# Patient Record
Sex: Female | Born: 1985 | Race: White | Hispanic: No
Health system: Southern US, Academic
[De-identification: ages and names within clinical notes are randomized; demographics above are authoritative.]

## PROBLEM LIST (undated history)

## (undated) ENCOUNTER — Encounter
Attending: Student in an Organized Health Care Education/Training Program | Primary: Student in an Organized Health Care Education/Training Program

## (undated) ENCOUNTER — Encounter: Attending: Psychiatric/Mental Health | Primary: Psychiatric/Mental Health

## (undated) ENCOUNTER — Telehealth: Attending: Psychiatric/Mental Health | Primary: Psychiatric/Mental Health

## (undated) ENCOUNTER — Encounter

## (undated) ENCOUNTER — Encounter: Attending: Family | Primary: Family

## (undated) ENCOUNTER — Telehealth
Attending: Student in an Organized Health Care Education/Training Program | Primary: Student in an Organized Health Care Education/Training Program

## (undated) ENCOUNTER — Encounter: Attending: Psychosomatic Medicine | Primary: Psychosomatic Medicine

## (undated) ENCOUNTER — Encounter: Attending: Gastroenterology | Primary: Gastroenterology

## (undated) ENCOUNTER — Ambulatory Visit: Attending: Clinical | Primary: Clinical

## (undated) ENCOUNTER — Encounter: Attending: Family Medicine | Primary: Family Medicine

## (undated) ENCOUNTER — Ambulatory Visit

## (undated) ENCOUNTER — Telehealth

## (undated) ENCOUNTER — Telehealth: Attending: Marriage & Family Therapist | Primary: Marriage & Family Therapist

## (undated) ENCOUNTER — Telehealth: Attending: Ambulatory Care | Primary: Ambulatory Care

## (undated) ENCOUNTER — Ambulatory Visit: Attending: Psychosomatic Medicine | Primary: Psychosomatic Medicine

## (undated) ENCOUNTER — Ambulatory Visit: Attending: Internal Medicine | Primary: Internal Medicine

## (undated) ENCOUNTER — Ambulatory Visit: Attending: Pharmacist | Primary: Pharmacist

## (undated) ENCOUNTER — Ambulatory Visit: Attending: Psychiatric/Mental Health | Primary: Psychiatric/Mental Health

## (undated) ENCOUNTER — Telehealth: Attending: Family | Primary: Family

## (undated) ENCOUNTER — Ambulatory Visit: Payer: MEDICAID | Attending: Addiction (Substance Use Disorder) | Primary: Addiction (Substance Use Disorder)

## (undated) ENCOUNTER — Telehealth: Attending: Clinical | Primary: Clinical

## (undated) ENCOUNTER — Telehealth: Attending: Addiction (Substance Use Disorder) | Primary: Addiction (Substance Use Disorder)

## (undated) ENCOUNTER — Ambulatory Visit
Attending: Student in an Organized Health Care Education/Training Program | Primary: Student in an Organized Health Care Education/Training Program

## (undated) ENCOUNTER — Ambulatory Visit: Attending: Women's Health | Primary: Women's Health

## (undated) ENCOUNTER — Ambulatory Visit: Payer: MEDICAID

## (undated) ENCOUNTER — Ambulatory Visit
Attending: Pharmacist Clinician (PhC)/ Clinical Pharmacy Specialist | Primary: Pharmacist Clinician (PhC)/ Clinical Pharmacy Specialist

## (undated) ENCOUNTER — Encounter: Attending: Ambulatory Care | Primary: Ambulatory Care

## (undated) ENCOUNTER — Ambulatory Visit: Attending: Nurse Practitioner | Primary: Nurse Practitioner

## (undated) ENCOUNTER — Encounter
Attending: Pharmacist Clinician (PhC)/ Clinical Pharmacy Specialist | Primary: Pharmacist Clinician (PhC)/ Clinical Pharmacy Specialist

## (undated) ENCOUNTER — Ambulatory Visit: Attending: Family | Primary: Family

## (undated) ENCOUNTER — Ambulatory Visit: Attending: Dermatology | Primary: Dermatology

## (undated) HISTORY — PX: WISDOM TOOTH EXTRACTION: SHX21

## (undated) HISTORY — PX: TONSILLECTOMY: SUR1361

## (undated) HISTORY — PX: OTHER SURGICAL HISTORY: SHX169

## (undated) MED ORDER — CLONIDINE HCL 0.1 MG TABLET: 0 mg | tablet | Freq: Four times a day (QID) | 2 refills | 30 days

---
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
## 2005-04-19 ENCOUNTER — Ambulatory Visit (HOSPITAL_COMMUNITY): Admission: RE | Admit: 2005-04-19 | Discharge: 2005-04-19 | Payer: Self-pay | Admitting: Obstetrics & Gynecology

## 2005-07-10 ENCOUNTER — Ambulatory Visit (HOSPITAL_COMMUNITY): Admission: RE | Admit: 2005-07-10 | Discharge: 2005-07-10 | Payer: Self-pay | Admitting: Obstetrics & Gynecology

## 2005-07-25 ENCOUNTER — Inpatient Hospital Stay (HOSPITAL_COMMUNITY): Admission: AD | Admit: 2005-07-25 | Discharge: 2005-07-25 | Payer: Self-pay | Admitting: Family Medicine

## 2005-07-26 ENCOUNTER — Inpatient Hospital Stay (HOSPITAL_COMMUNITY): Admission: AD | Admit: 2005-07-26 | Discharge: 2005-07-30 | Payer: Self-pay | Admitting: Obstetrics

## 2005-10-03 ENCOUNTER — Ambulatory Visit: Payer: Self-pay | Admitting: Family Medicine

## 2006-04-26 DIAGNOSIS — L900 Lichen sclerosus et atrophicus: Secondary | ICD-10-CM

## 2006-04-26 HISTORY — DX: Lichen sclerosus et atrophicus: L90.0

## 2007-01-04 IMAGING — US US ABDOMEN COMPLETE
1 series · 14 of 25 positions shown · non-contrast
Comparison: none

CLINICAL DATA: Abdominal pain.  The patient is currently 22 weeks pregnancy.
ABDOMINAL ULTRASOUND:
Multiple images of the abdomen were obtained.
The liver parenchyma is homogeneous in echotexture with no evidence for focal parenchymal abnormality or intrahepatic ductal dilatation.  The gallbladder is well distended and shows no evidence for intraluminal stones or sludge.  No pericholecystic fluid or gallbladder wall thickening is seen.  The common bile duct measures 1.6 mm in AP width and this is within normal limits for size.  
Both kidneys were well-seen with the right kidney having a sagittal length of 9.9 cm and the left kidney having a sagittal length of 10.2 cm.  No signs of hydronephrosis or focal parenchymal abnormalities are seen.  
The pancreas is seen in its entirety and is normal in size and echotexture, as is the spleen.  The proximal portions of the inferior vena cava and abdominal aorta are unremarkable.  The distal aspect of these two vessels were obscured by the gravid uterus.

[Series 1: us abdomen complete · 0.31mm/px · 14 of 73 slices shown]
[im 1/73]
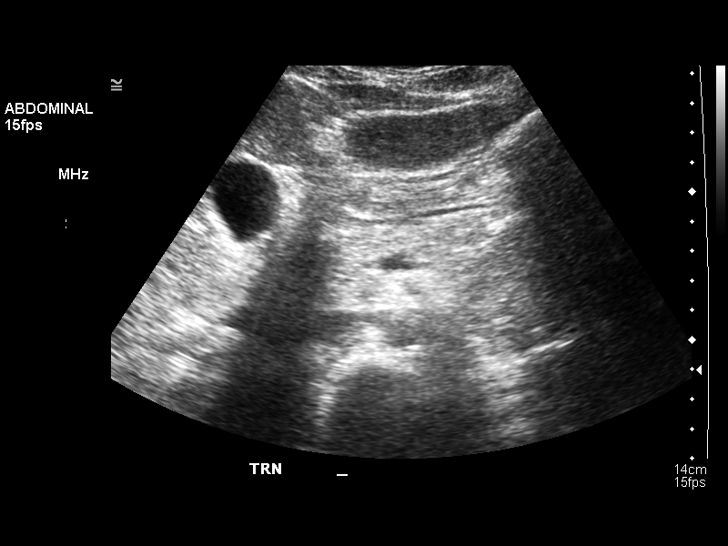
[im 7/73]
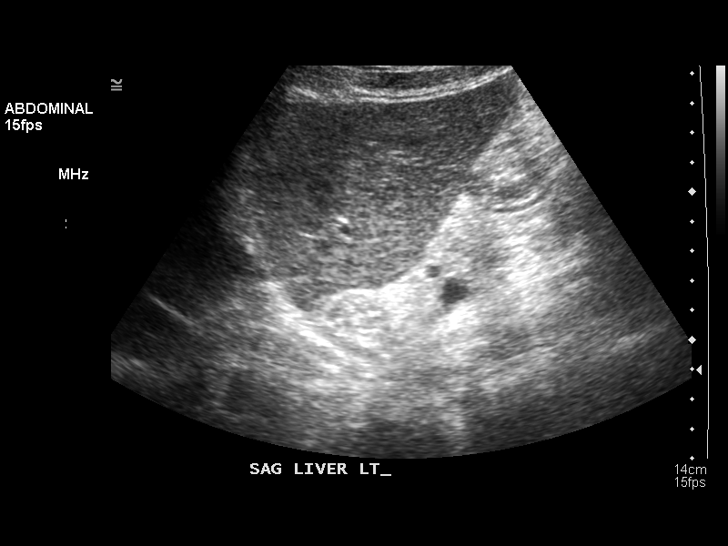
[im 13/73]
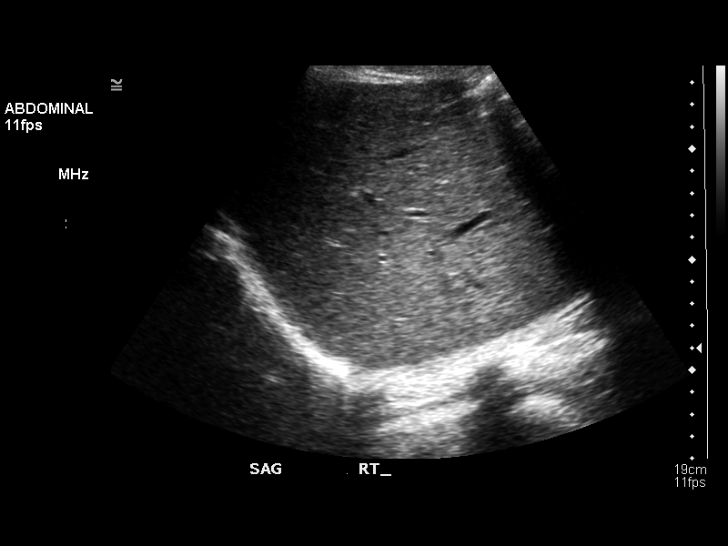
[im 19/73]
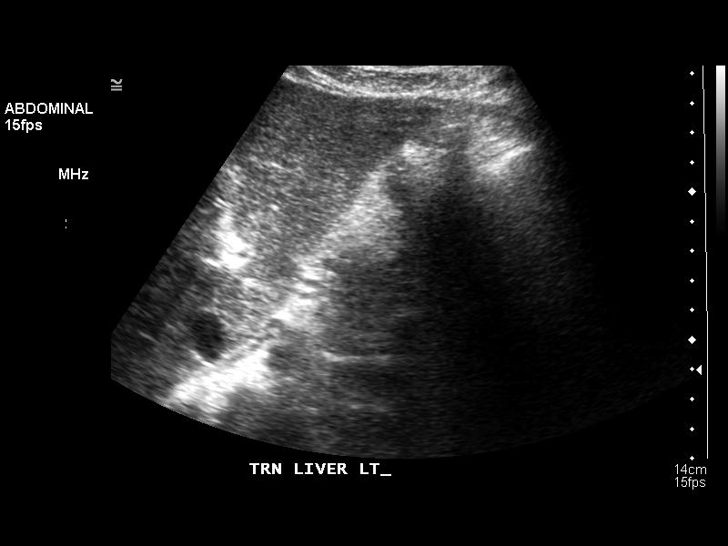
[im 25/73]
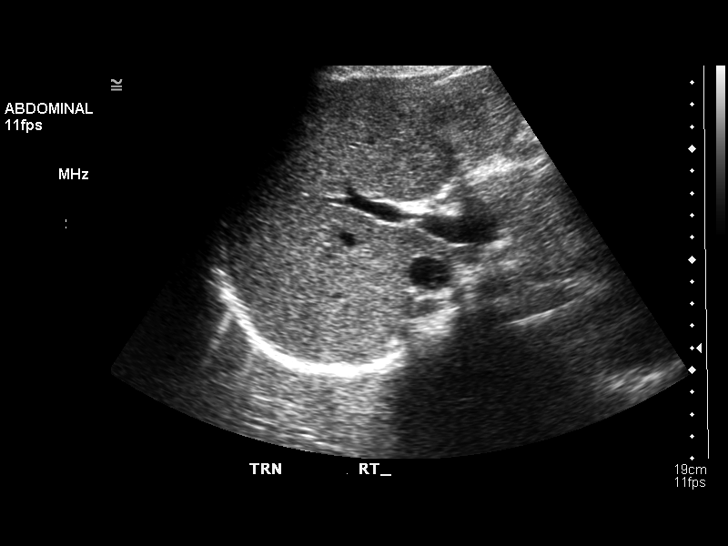
[im 28/73]
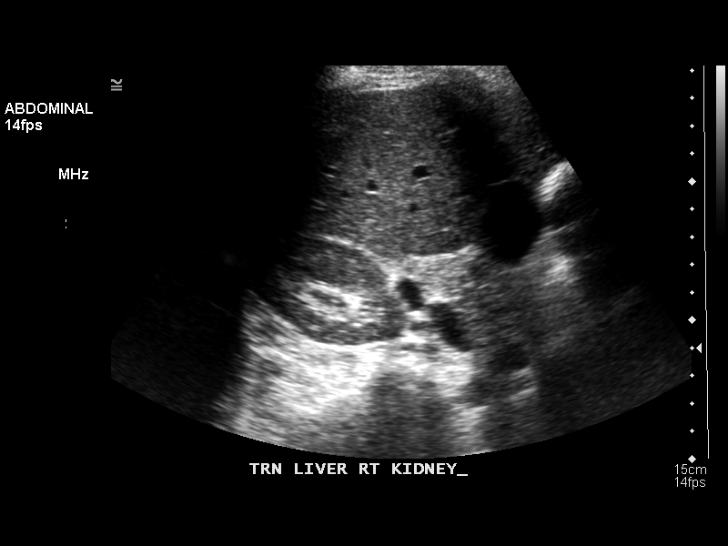
[im 34/73]
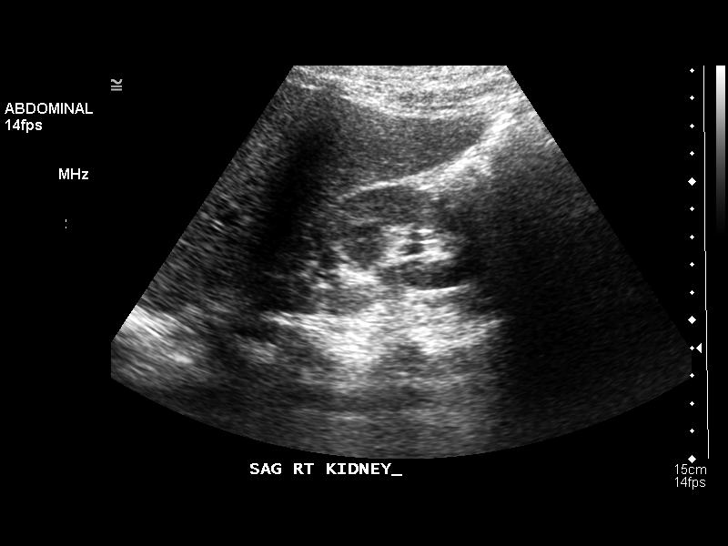
[im 40/73]
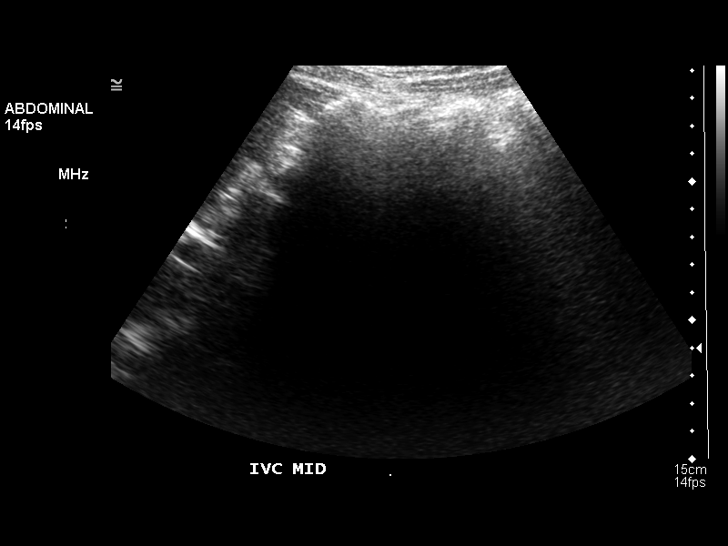
[im 46/73]
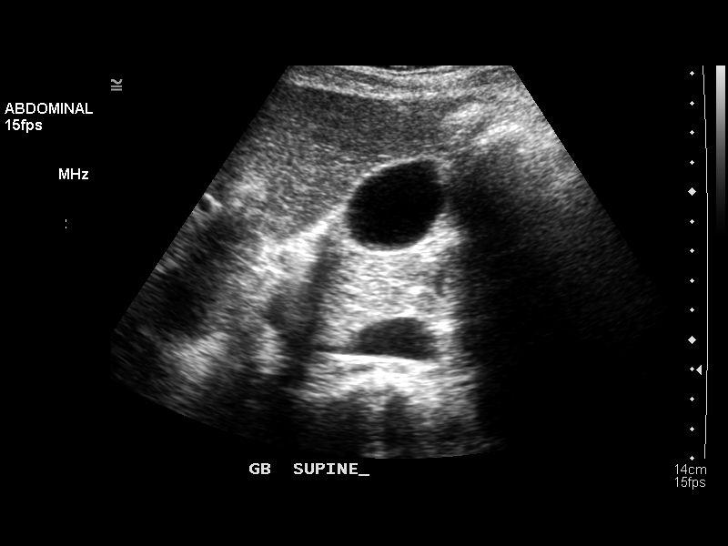
[im 49/73]
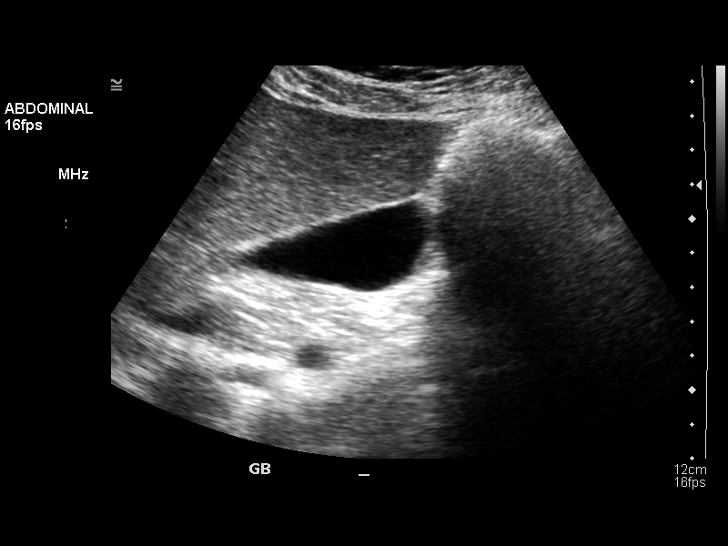
[im 55/73]
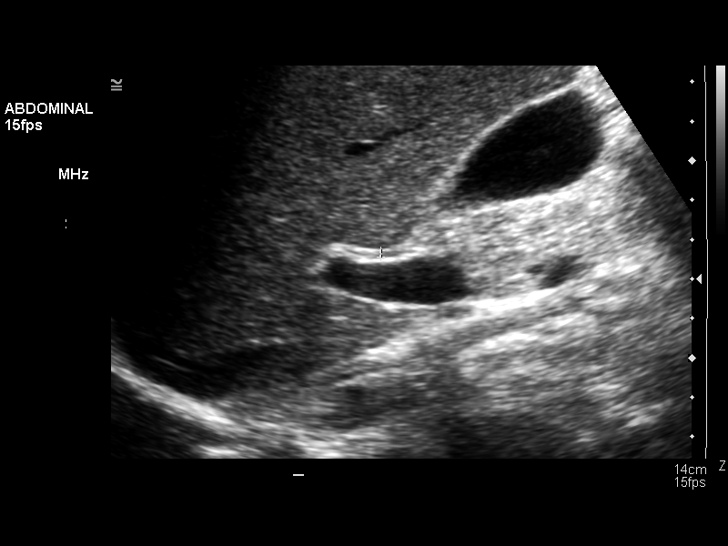
[im 61/73]
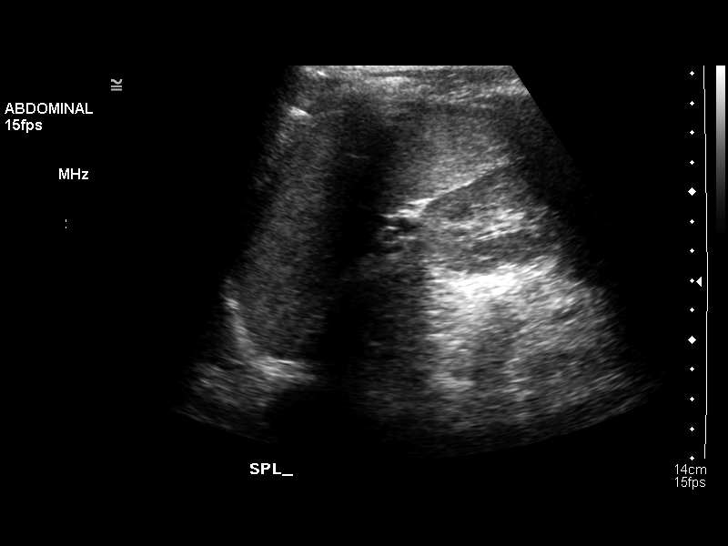
[im 67/73]
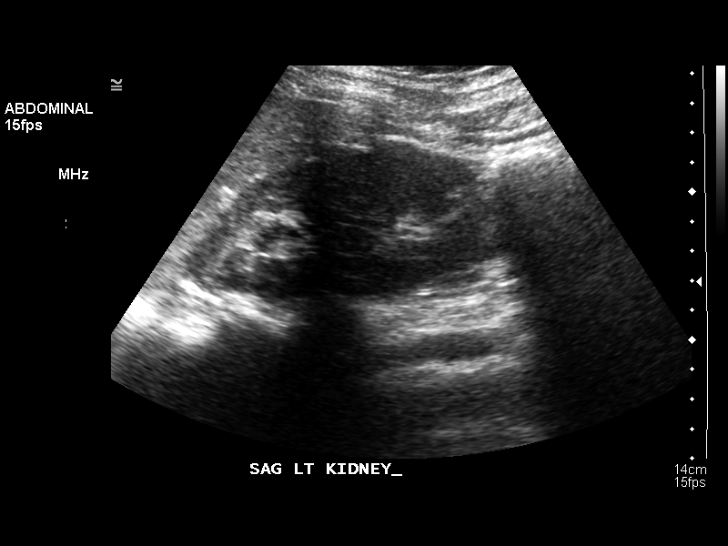
[im 73/73]
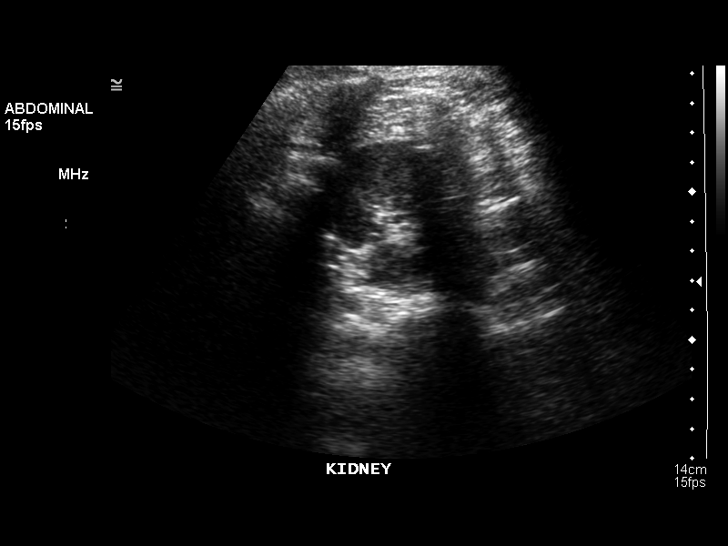

[14 of 25 positions shown; findings below may reference images not displayed]

IMPRESSION: Normal abdominal ultrasound.

## 2007-03-27 IMAGING — US US FETAL BPP W/O NONSTRESS
1 series · 14 of 27 positions shown · non-contrast
Comparison: none

CLINICAL DATA: Elevated blood pressure and nonreactive NST.  Assigned gestational age is 34 weeks 0 days.

[Series 1: us fetal bpp w/o nonstress · 0.41mm/px · 14 of 27 slices shown]
[im 1/27]
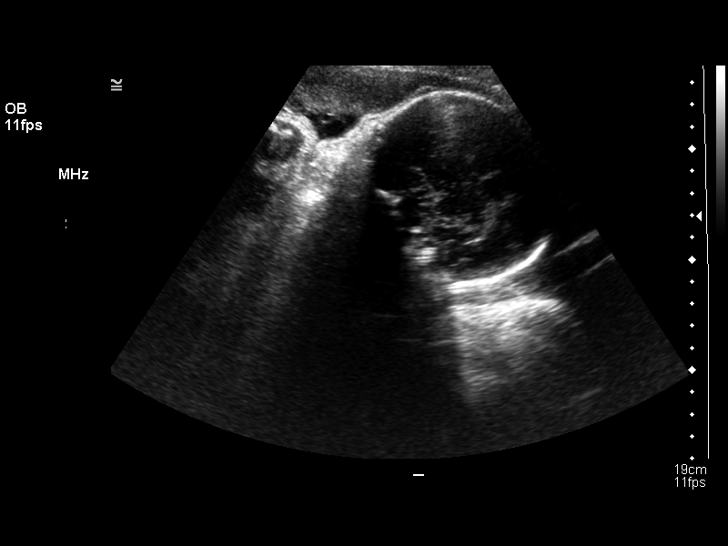
[im 3/27]
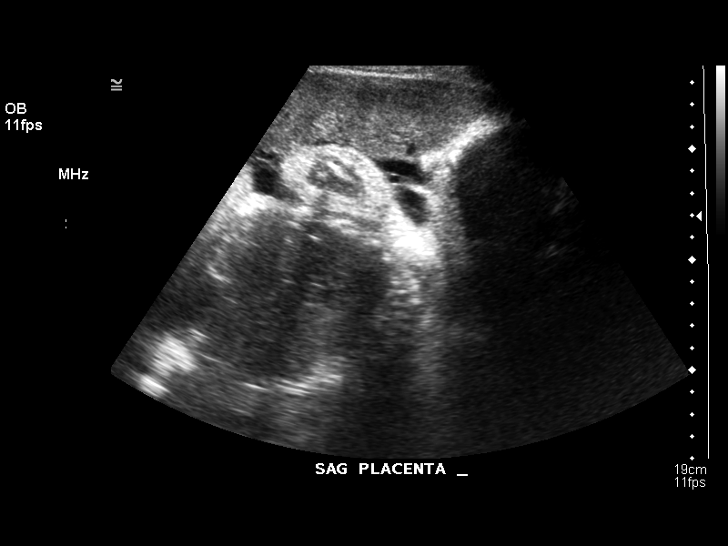
[im 5/27]
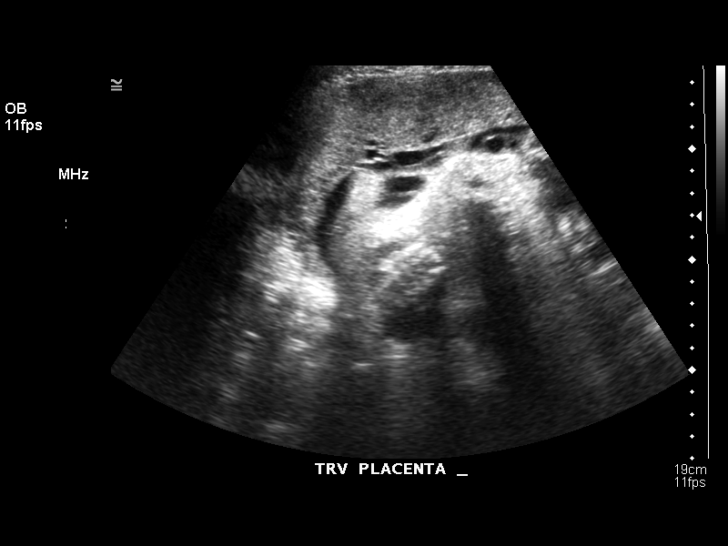
[im 7/27]
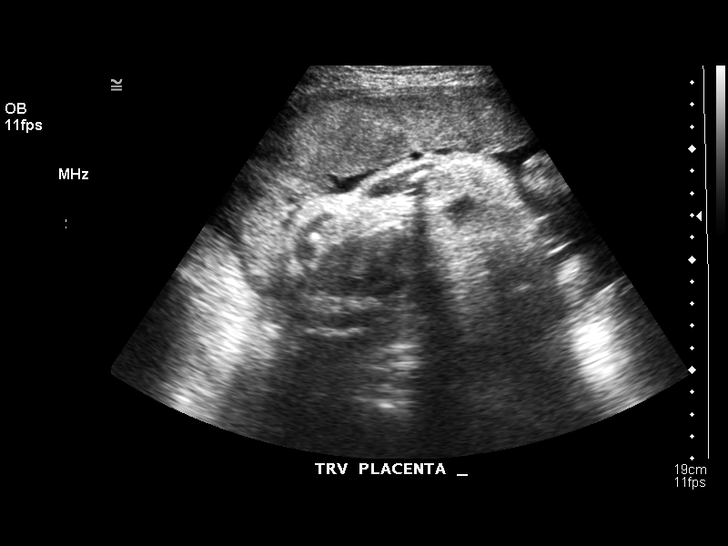
[im 9/27]
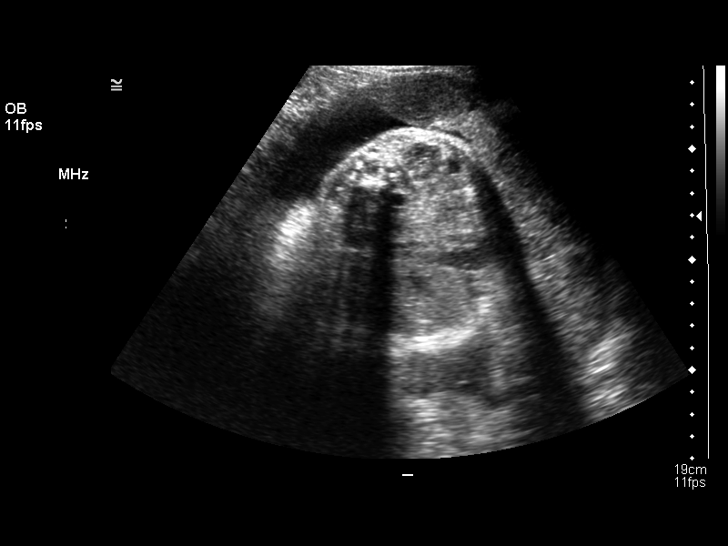
[im 11/27]
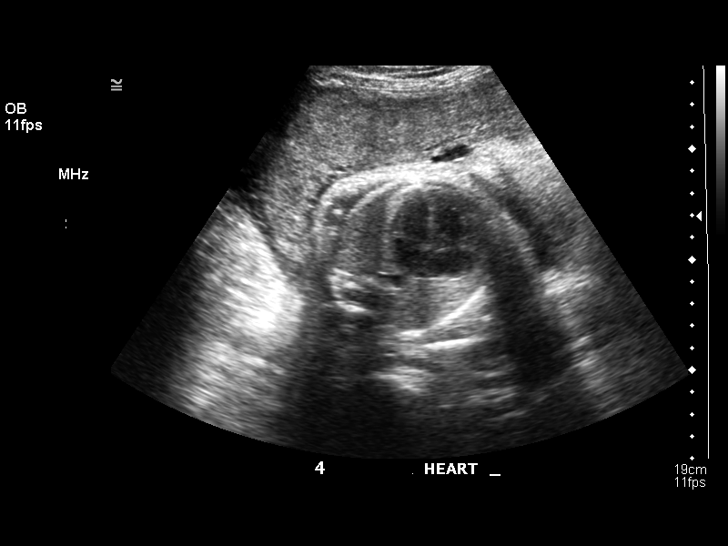
[im 13/27]
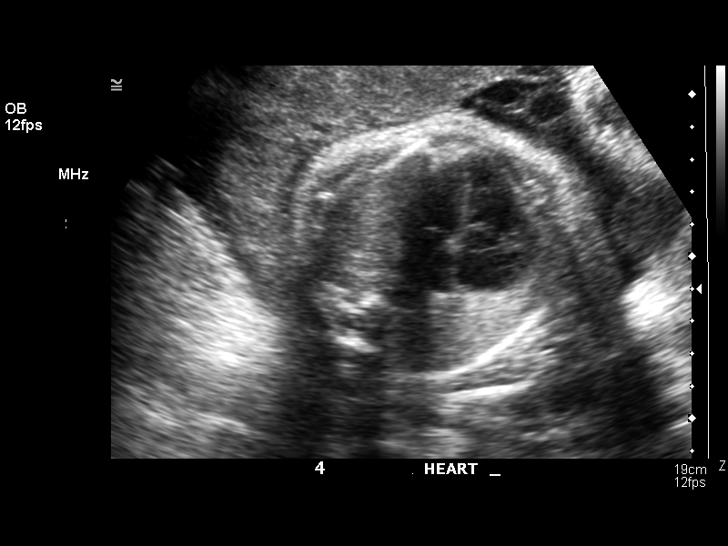
[im 15/27]
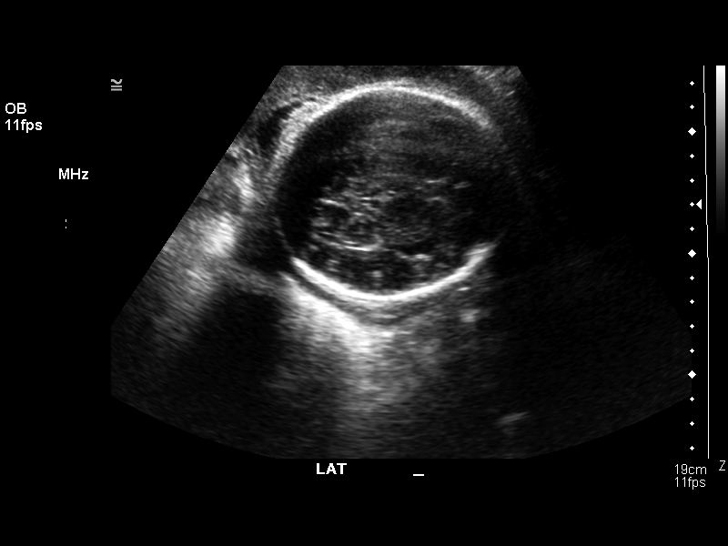
[im 17/27]
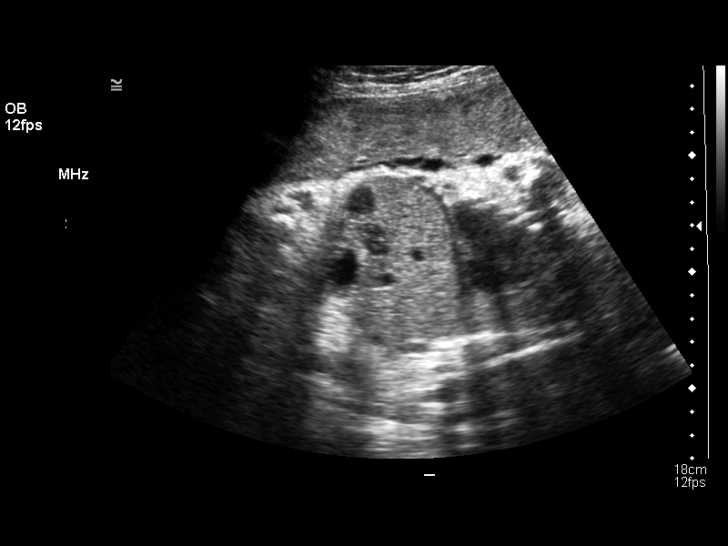
[im 19/27]
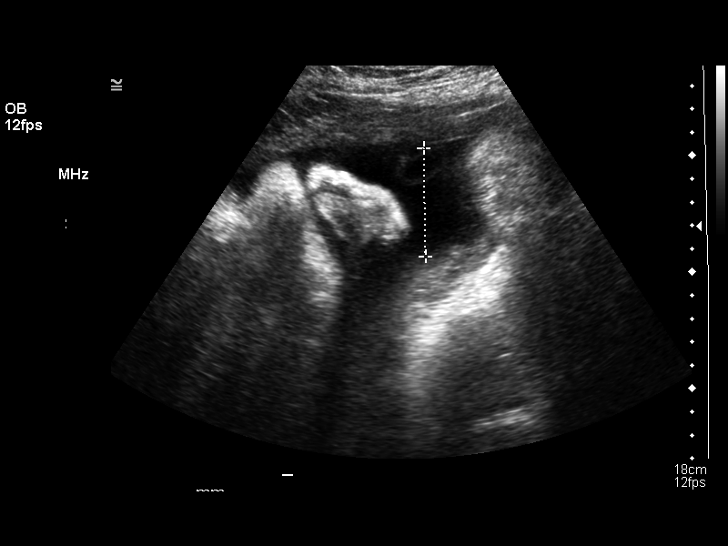
[im 21/27]
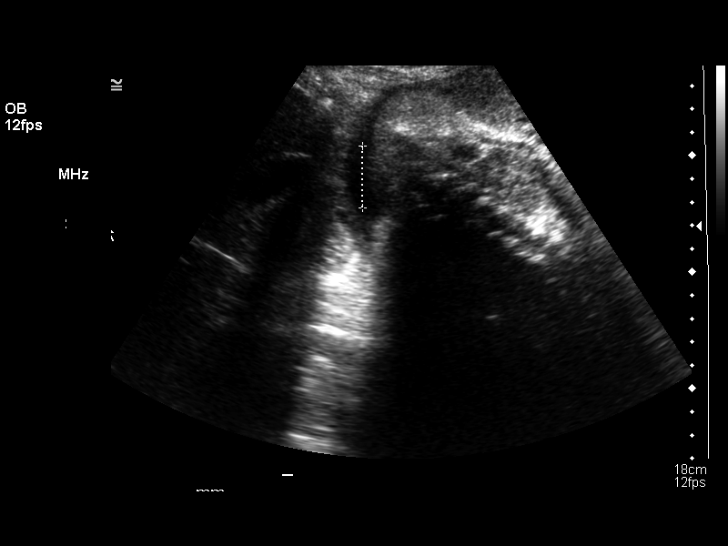
[im 23/27]
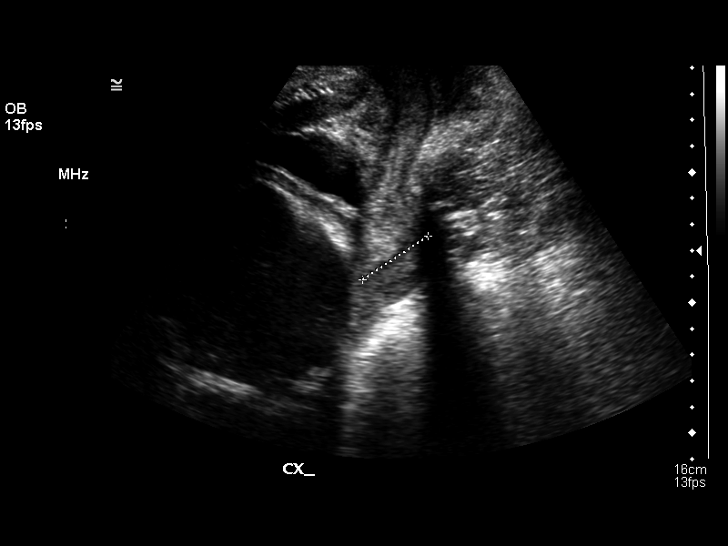
[im 25/27]
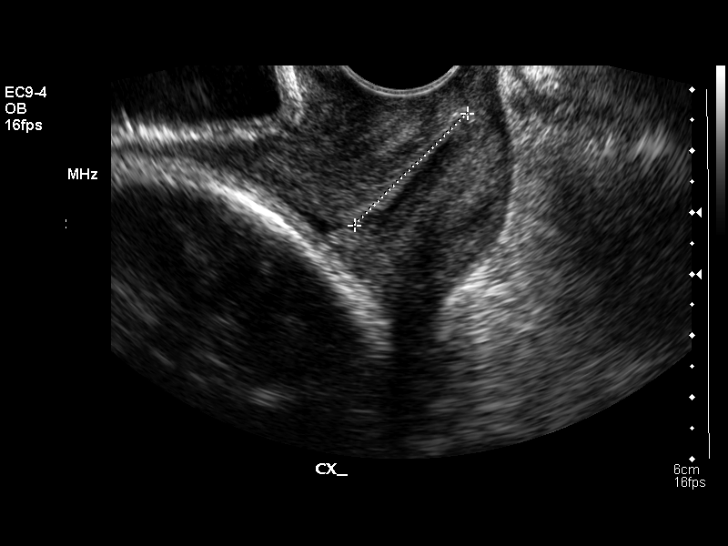
[im 27/27]
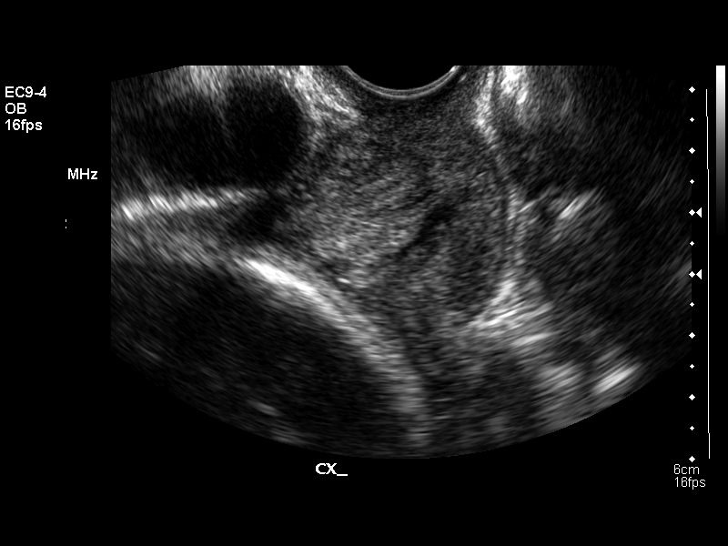

[14 of 27 positions shown; findings below may reference images not displayed]

LIMITED OBSTETRICAL ULTRASOUND WITH TRANSVAGINAL:
 Number of Fetuses:  1
 Heart Rate:   147
 Movement:  Yes
 Breathing:  Yes
 Presentation: Cephalic
 Placental Location:  Anterior
 Grade:  I
 Previa:  No
 Amniotic Fluid (Subjective):  Normal
 Amniotic Fluid (Objective):  14.3 cm AFI (5th -95th%ile = 8.1 ? 24.8 cm for 34 wks)

 Fetal measurements and complete anatomic evaluation were not requested.  The following fetal anatomy was visualized during this exam:  Lateral ventricles, four chamber heart, stomach, kidneys and bladder. 

 MATERNAL UTERINE AND ADNEXAL FINDINGS
 Cervix:  3.0 cm Transvaginally

 BIOPHYSICAL PROFILE

 Movement: 2    Time:  20 minutes
 Breathing:  2
 Tone:  2
 Amniotic Fluid:  2

 Total Score:  8
IMPRESSION: There is a single living intrauterine gestation in cephalic presentation.  The biophysical score is [DATE] over a 20 minute period.  The amniotic fluid volume is within normal limits with a total AFI of 14.3 cm.

## 2010-03-15 DIAGNOSIS — N739 Female pelvic inflammatory disease, unspecified: Secondary | ICD-10-CM

## 2010-03-15 HISTORY — DX: Female pelvic inflammatory disease, unspecified: N73.9

## 2010-05-26 DIAGNOSIS — A6000 Herpesviral infection of urogenital system, unspecified: Secondary | ICD-10-CM | POA: Insufficient documentation

## 2010-05-26 HISTORY — DX: Herpesviral infection of urogenital system, unspecified: A60.00

## 2012-02-04 DIAGNOSIS — N83209 Unspecified ovarian cyst, unspecified side: Secondary | ICD-10-CM

## 2012-02-04 HISTORY — DX: Unspecified ovarian cyst, unspecified side: N83.209

## 2012-10-02 DIAGNOSIS — M069 Rheumatoid arthritis, unspecified: Secondary | ICD-10-CM | POA: Insufficient documentation

## 2012-10-02 DIAGNOSIS — D279 Benign neoplasm of unspecified ovary: Secondary | ICD-10-CM

## 2012-10-02 HISTORY — DX: Rheumatoid arthritis, unspecified: M06.9

## 2012-10-02 HISTORY — DX: Benign neoplasm of unspecified ovary: D27.9

## 2012-11-17 DIAGNOSIS — N904 Leukoplakia of vulva: Secondary | ICD-10-CM

## 2012-11-17 HISTORY — DX: Leukoplakia of vulva: N90.4

## 2013-01-06 DIAGNOSIS — L280 Lichen simplex chronicus: Secondary | ICD-10-CM | POA: Insufficient documentation

## 2013-01-06 HISTORY — DX: Lichen simplex chronicus: L28.0

## 2013-02-20 DIAGNOSIS — B009 Herpesviral infection, unspecified: Secondary | ICD-10-CM

## 2013-02-20 DIAGNOSIS — N760 Acute vaginitis: Secondary | ICD-10-CM

## 2013-02-20 HISTORY — DX: Herpesviral infection, unspecified: B00.9

## 2013-02-20 HISTORY — DX: Acute vaginitis: N76.0

## 2016-04-15 DIAGNOSIS — B1920 Unspecified viral hepatitis C without hepatic coma: Secondary | ICD-10-CM

## 2016-04-15 HISTORY — DX: Unspecified viral hepatitis C without hepatic coma: B19.20

## 2017-03-22 DIAGNOSIS — F4310 Post-traumatic stress disorder, unspecified: Secondary | ICD-10-CM

## 2017-03-22 HISTORY — DX: Post-traumatic stress disorder, unspecified: F43.10

## 2017-08-23 ENCOUNTER — Ambulatory Visit: Admission: RE | Admit: 2017-08-23 | Discharge: 2017-08-23 | Disposition: A | Discharge status: Home / Self Care

## 2017-08-23 DIAGNOSIS — B1920 Unspecified viral hepatitis C without hepatic coma: Secondary | ICD-10-CM

## 2017-08-23 DIAGNOSIS — M0520 Rheumatoid vasculitis with rheumatoid arthritis of unspecified site: Principal | ICD-10-CM

## 2017-10-24 ENCOUNTER — Ambulatory Visit: Admit: 2017-10-24 | Discharge: 2017-10-24

## 2017-10-24 DIAGNOSIS — M069 Rheumatoid arthritis, unspecified: Principal | ICD-10-CM

## 2017-10-24 DIAGNOSIS — R899 Unspecified abnormal finding in specimens from other organs, systems and tissues: Secondary | ICD-10-CM

## 2017-10-24 MED ORDER — MELOXICAM 15 MG TABLET
ORAL_TABLET | Freq: Every day | ORAL | 5 refills | 0.00000 days | Status: CP
Start: 2017-10-24 — End: 2017-11-04

## 2017-10-24 MED ORDER — HYDROXYCHLOROQUINE 200 MG TABLET: tablet | 0 refills | 0 days

## 2017-10-24 MED ORDER — HYDROXYCHLOROQUINE 200 MG TABLET: 200 mg | tablet | Freq: Two times a day (BID) | 2 refills | 0 days | Status: AC

## 2017-10-24 MED ORDER — SULFASALAZINE 500 MG TABLET,DELAYED RELEASE
ORAL_TABLET | Freq: Two times a day (BID) | ORAL | 4 refills | 0.00000 days | Status: CP
Start: 2017-10-24 — End: 2018-02-20

## 2017-10-24 MED ORDER — SULFASALAZINE 500 MG TABLET,DELAYED RELEASE: 1000 mg | tablet | Freq: Two times a day (BID) | 4 refills | 0 days | Status: AC

## 2017-10-24 MED ORDER — HYDROXYCHLOROQUINE 200 MG TABLET
ORAL_TABLET | Freq: Two times a day (BID) | ORAL | 0 refills | 0.00000 days | Status: CP
Start: 2017-10-24 — End: 2017-10-24

## 2017-10-24 MED ORDER — PREDNISONE 10 MG TABLET
ORAL_TABLET | Freq: Every day | ORAL | 3 refills | 0.00000 days | Status: CP
Start: 2017-10-24 — End: 2017-11-04

## 2017-10-24 MED ORDER — SULFASALAZINE 500 MG TABLET,DELAYED RELEASE: 1000 mg | tablet | Freq: Two times a day (BID) | 11 refills | 0 days | Status: AC

## 2017-10-28 ENCOUNTER — Ambulatory Visit
Admit: 2017-10-28 | Discharge: 2017-10-29 | Attending: Student in an Organized Health Care Education/Training Program | Primary: Student in an Organized Health Care Education/Training Program

## 2017-10-28 DIAGNOSIS — K5909 Other constipation: Secondary | ICD-10-CM

## 2017-10-28 DIAGNOSIS — Z0000 Encounter for general adult medical examination without abnormal findings: Secondary | ICD-10-CM

## 2017-10-28 DIAGNOSIS — F329 Major depressive disorder, single episode, unspecified: Principal | ICD-10-CM

## 2017-10-28 DIAGNOSIS — K219 Gastro-esophageal reflux disease without esophagitis: Secondary | ICD-10-CM

## 2017-10-28 DIAGNOSIS — I10 Essential (primary) hypertension: Secondary | ICD-10-CM

## 2017-10-28 DIAGNOSIS — F319 Bipolar disorder, unspecified: Secondary | ICD-10-CM

## 2017-10-28 MED ORDER — LINACLOTIDE 145 MCG CAPSULE: 1 | capsule | 0 refills | 0 days

## 2017-10-28 MED ORDER — DIVALPROEX 125 MG TABLET,DELAYED RELEASE
Freq: Three times a day (TID) | ORAL | 0 refills | 0.00000 days | Status: CP
Start: 2017-10-28 — End: 2017-10-28

## 2017-10-28 MED ORDER — AMLODIPINE 5 MG TABLET
ORAL_TABLET | Freq: Every day | ORAL | 0 refills | 0.00000 days | Status: CP
Start: 2017-10-28 — End: 2017-12-25

## 2017-10-28 MED ORDER — OMEPRAZOLE 20 MG CAPSULE,DELAYED RELEASE
ORAL_CAPSULE | ORAL | 0 refills | 0 days
Start: 2017-10-28 — End: 2018-07-17

## 2017-10-28 MED ORDER — OMEPRAZOLE 40 MG CAPSULE,DELAYED RELEASE
ORAL_CAPSULE | Freq: Every day | ORAL | 1 refills | 0.00000 days | Status: CP
Start: 2017-10-28 — End: 2017-12-25

## 2017-10-28 MED ORDER — LINACLOTIDE 145 MCG CAPSULE
ORAL_CAPSULE | Freq: Every day | ORAL | 0 refills | 0.00000 days | Status: CP
Start: 2017-10-28 — End: 2017-10-28

## 2017-10-28 MED ORDER — VORTIOXETINE 5 MG TABLET
ORAL_TABLET | Freq: Every day | ORAL | 0 refills | 0.00000 days | Status: CP
Start: 2017-10-28 — End: 2017-10-28

## 2017-10-28 MED ORDER — AMLODIPINE 5 MG TABLET: 5 mg | tablet | 0 refills | 0 days

## 2017-10-28 MED ORDER — DIVALPROEX 125 MG TABLET,DELAYED RELEASE: 125 mg | tablet | Freq: Three times a day (TID) | 0 refills | 0 days | Status: AC

## 2017-10-28 MED ORDER — VORTIOXETINE 5 MG TABLET: 10 mg | tablet | Freq: Every day | 0 refills | 0 days | Status: AC

## 2017-10-29 MED FILL — AMLODIPINE/5MG/TABS: AMLODIPINE/5MG/TABS | 90 days supply | Qty: 90 | Fill #0

## 2017-10-29 MED FILL — DIVALPROEX SODIUM DR/125MG DR/TBEC: DIVALPROEX SODIUM DR/125MG DR/TBEC | 60 days supply | Qty: 180 | Fill #0

## 2017-10-29 MED FILL — OMEPRAZOLE/20MG/CPDR: OMEPRAZOLE/20MG/CPDR | 60 days supply | Qty: 120 | Fill #0

## 2017-11-05 MED ORDER — MELOXICAM 15 MG TABLET
ORAL_TABLET | Freq: Every day | ORAL | 3 refills | 0.00000 days | Status: CP
Start: 2017-11-05 — End: 2017-11-05

## 2017-11-05 MED ORDER — PREDNISONE 10 MG TABLET: 10 mg | tablet | 3 refills | 0 days

## 2017-11-05 MED ORDER — MELOXICAM 15 MG TABLET: 15 mg | tablet | Freq: Every day | 3 refills | 0 days | Status: AC

## 2017-11-05 MED ORDER — PREDNISONE 10 MG TABLET
ORAL_TABLET | Freq: Every day | ORAL | 3 refills | 0.00000 days | Status: CP
Start: 2017-11-05 — End: 2017-11-06

## 2017-11-06 MED ORDER — FLUOXETINE 20 MG CAPSULE
ORAL_CAPSULE | 0 refills | 0 days
Start: 2017-11-06 — End: 2018-07-03

## 2017-11-06 MED ORDER — FLUOXETINE 20 MG TABLET
ORAL_TABLET | Freq: Every day | ORAL | 0 refills | 0 days | Status: CP
Start: 2017-11-06 — End: 2017-12-26

## 2017-11-08 MED ORDER — PREDNISONE 10 MG TABLET
ORAL_TABLET | Freq: Every day | ORAL | 3 refills | 0 days | Status: CP
Start: 2017-11-08 — End: 2018-03-31

## 2017-11-12 MED FILL — FLUOXETINE HCL/20MG/CAPS: FLUOXETINE HCL/20MG/CAPS | 90 days supply | Qty: 90 | Fill #0

## 2017-11-13 ENCOUNTER — Ambulatory Visit: Admit: 2017-11-13 | Discharge: 2017-11-14 | Attending: Ophthalmology | Primary: Ophthalmology

## 2017-11-13 DIAGNOSIS — M069 Rheumatoid arthritis, unspecified: Principal | ICD-10-CM

## 2017-11-13 DIAGNOSIS — Z79899 Other long term (current) drug therapy: Secondary | ICD-10-CM

## 2017-11-27 MED FILL — PREDNISONE/10MG/TABS: PREDNISONE/10MG/TABS | 30 days supply | Qty: 30 | Fill #0

## 2017-12-02 ENCOUNTER — Ambulatory Visit: Admit: 2017-12-02 | Discharge: 2017-12-02 | Attending: Gastroenterology | Primary: Gastroenterology

## 2017-12-02 DIAGNOSIS — B1920 Unspecified viral hepatitis C without hepatic coma: Principal | ICD-10-CM

## 2017-12-02 LAB — HEPATIC FUNCTION PANEL
ALBUMIN: 4 g/dL (ref 3.5–5.0)
ALKALINE PHOSPHATASE: 108 U/L (ref 38–126)
AST (SGOT): 98 U/L — ABNORMAL HIGH (ref 14–38)
BILIRUBIN DIRECT: <0.1 mg/dL (ref 0.00–0.40)
BILIRUBIN TOTAL: 0.3 mg/dL (ref 0.0–1.2)

## 2017-12-02 LAB — ALBUMIN: Albumin:MCnc:Pt:Ser/Plas:Qn:: 4

## 2017-12-02 LAB — INR: Lab: 1.02

## 2017-12-02 NOTE — Unmapped (Signed)
[facility] VISIT    REFERRING PROVIDER: Winifred Olive, FNP  [address]  [id number]  [place], [place] [address]    PRIMARY CARE PROVIDER: Glendale Chard, MD    PATIENT PROFILE: Erica Jenkins is a 32 y.o. female (DOB: 12-17-1985) who is seen in consultation at the request of Dr. Laurice Record for evaluation of chronic hepatitis C.    SUBJECTIVE:     CHIEF COMPLAINT: Chronic hepatis C    HISTORY OF PRESENT ILLNESS:      Erica Jenkins is a 32 y.o. woman with history significant for bipolar disorder, chronic hepatitis C, migraine disorder, pelvic inflammatory disease, rheumatoid arthritis, and polysubstance abuse in remission who presents for chronic hepatitis C.     She was tested 2-3 ago and negative for hepatitis C, then tested positive about one year prior. She was using IV drugs during that interval but has been clean about one year.    She has her own car, lives with her brother, working as a Diplomatic Services operational officer. She doesn't feel getting to appointments would be difficult.    She denies any history of jaundice, scleral icterus, lower extremity edema, ascites, confusion, hematemesis, melena, or hematochezia.    She recalls she has had hepatitis vaccination in schools.     She has never drank daily, never used significant marijuana, was never obese. She has not been on methotrexate for her rheumatoid arthritis.    REVIEW OF SYSTEMS:   The balance of 12 systems reviewed is negative except as noted in the history of present illness.    Past Medical History:   Diagnosis Date   ??? Bipolar disease, chronic (CMS-HCC)    ??? Chronic hepatitis C (CMS-HCC)    ??? Migraines    ??? Pelvic inflammation in female    ??? Rheumatoid arthritis (CMS-HCC)    ??? 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 Tobacco abuse      Past Surgical History:   Procedure Laterality Date   ??? CESAREAN SECTION     ??? CHOLECYSTECTOMY  2017    acute cholecystitis   ??? HERNIA REPAIR     ??? OVARIAN CYST SURGERY     ??? TONSILLECTOMY AND ADENOIDECTOMY     ??? TUBAL LIGATION       Current Outpatient Prescriptions   Medication Sig Dispense Refill   ??? amLODIPine (NORVASC) 5 MG tablet Take 1 tablet (5 mg total) by mouth daily. 90 tablet 0   ??? clonazePAM (KLONOPIN) 1 MG tablet Take 1 mg by mouth Three (3) times a day.     ??? clonazePAM (KLONOPIN) 1 MG tablet Take 1 mg by mouth Three (3) times a day.     ??? diphenhydrAMINE (BENADRYL) 25 mg capsule/tablet Take 25 mg by mouth every six (6) hours as needed for itching.     ??? divalproex (DEPAKOTE) 125 MG DR tablet Take 1 tablet (125 mg total) by mouth Three (3) times a day. 180 tablet 0   ??? docusate sodium (COLACE) 100 MG capsule Take 100 mg by mouth daily.     ??? FLUoxetine (PROZAC) 20 MG tablet Take 1 tablet (20 mg total) by mouth daily. 90 tablet 0   ??? hydroxychloroquine (PLAQUENIL) 200 mg tablet Take 1 tablet (200 mg total) by mouth Two (2) times a day. 60 tablet 4   ??? linaclotide (LINZESS) 145 mcg capsule Take 1 capsule (145 mcg total) by mouth daily. 30 capsule 0   ??? meloxicam (MOBIC) 15 MG  tablet Take 1 tablet (15 mg total) by mouth daily. 90 tablet 3   ??? omeprazole (PRILOSEC) 40 MG capsule Take 1 capsule (40 mg total) by mouth daily. 30 capsule 1   ??? oxyCODONE (OXY-IR) 5 mg capsule Take 5 mg by mouth Every six (6) hours.     ??? predniSONE (DELTASONE) 10 MG tablet Take 1 tablet (10 mg total) by mouth daily. 30 tablet 3   ??? promethazine (PHENERGAN) 25 MG tablet Take 25 mg by mouth every six (6) hours as needed for nausea.     ??? sulfaSALAzine (AZULFIDINE) 500 MG EC tablet Take 2 tablets (1,000 mg total) by mouth Two (2) times a day. 120 tablet 4   ??? acetaminophen (ARTHRITIS PAIN RELIEF, ACETAM,) 650 MG CR tablet Take 650 mg by mouth every eight (8) hours as needed for pain.     ??? clobetasol (TEMOVATE) 0.05 % ointment Apply nightly to affected are on the vulva. Use only a pea sized amount. (Patient not taking: Reported on 12/02/2017) 15 g 1   ??? doxycycline (DORYX) 100 MG EC tablet Take 100 mg by mouth Two (2) times a day.     ??? ibuprofen (ADVIL,MOTRIN) 200 MG tablet Take 200 mg by mouth every six (6) hours as needed for pain.       No current facility-administered medications for this visit.      Allergies  Reviewed on: 12/02/2017      Reactions Comment    Etanercept  Other reaction(s): HIVES        Family History   Problem Relation Age of Onset   ??? Hypertension Mother    ??? Bipolar disorder Father    ??? Alzheimer's disease Maternal Grandfather    ??? Prostate cancer Maternal Grandfather    ??? Diabetes Maternal Grandfather    ??? Heart attack Maternal Grandfather    ??? Breast cancer Maternal Grandmother      Social History   Substance Use Topics   ??? Smoking status: Current Every Day Smoker     Packs/day: 0.50   ??? Smokeless tobacco: Never Used   ??? Alcohol use No       OBJECTIVE:   VITAL SIGNS: BP 113/71  - Pulse 55  - Temp 36.9 ??C  - Resp 20  - Ht 157.5 cm (5' 2)  - Wt 68.7 kg (151 lb 6.4 oz)  - LMP 11/25/2017 (Approximate)  - SpO2 99%  - BMI 27.69 kg/m??     PHYSICAL EXAM:  General appearance: Appears well, no distress.  Eyes: Anicteric sclera. No erythema.  ENT: No oral ulcers. Posterior oropharynx unremarkable.  Cardiovascular: RRR without murmurs, heaves, or thrills. No lower extremity edema.  Pulmonary: Normal work of breathing. Acyanotic.  Abdominal: soft, nontender, nondistended, no masses or organomegaly, diffuse stria.  Musculoskeletal: No temporal wasting. Normal joints of the hand.  Skin: No jaundice. No rashes.  Neurologic: Alert, oriented, and appropriate.  Psychiatric: Appropriate.    GI PROCEDURES:  None.    RADIOGRAPHIC STUDIES:  None germane.     LABORATORY RESULTS:  CBC:  Lab Results   Component Value Date    WBC 4.9 10/24/2017    WBC 8.0 10/02/2012    WBC 4.9 10/02/2012    WBC 5.1 10/01/2012    NEUTROABS 3.5 10/24/2017    NEUTROABS 3.8 10/01/2012    HGB 12.4 10/24/2017    HGB 11.4 (L) 10/02/2012    HGB 10.3 (L) 10/02/2012    HGB 11.2 (L) 10/01/2012  PLT 250 10/24/2017    PLT 237 10/02/2012    PLT 217 10/02/2012    PLT 247 10/01/2012     BMP:  Lab Results   Component Value Date    NA 141 10/24/2017    NA 142 10/01/2012    K 4.5 10/24/2017    K 4.4 10/01/2012    CL 105 10/24/2017    CL 102 10/01/2012    CO2 28.0 10/24/2017    CO2 26 10/01/2012    BUN 19 10/24/2017    BUN 10 10/01/2012    CREATININE 0.81 10/24/2017    CREATININE 0.91 10/01/2012    BCR 23 10/24/2017    BCR 11 10/01/2012    GLU 92 10/24/2017    GLU 85 10/01/2012    CALCIUM 9.0 10/24/2017     Liver panel:  Lab Results   Component Value Date    ALBUMIN 4.1 10/24/2017    PROT 7.7 10/24/2017    BILITOT 0.4 10/24/2017    AST 65 (H) 10/24/2017    ALT 92 (H) 10/24/2017    ALKPHOS 219 (HH) 10/24/2017     Anemia:  MCV   Date Value Ref Range Status   10/24/2017 94.6 80.0 - 100.0 fL Final   10/02/2012 93 80 - 100 FL Final     RDW   Date Value Ref Range Status   10/24/2017 12.9 12.0 - 15.0 % Final   10/02/2012 12.6 12.0 - 15.0 % Final     Luminal etiologies:  CRP   Date Value Ref Range Status   10/24/2017 <5.0 <10.0 mg/L Final   09/02/2012 0.5 0.0 - 1.0 MG/DL Final     Sed Rate   Date Value Ref Range Status   10/24/2017 15 0 - 20 mm/h Final   09/02/2012 18 0 - 20 MM/HR Final     Liver etiologies:  Hep B Core Total Ab   Date Value Ref Range Status   10/24/2017 Nonreactive Nonreactive Final     HCV RNA (IU)   Date Value Ref Range Status   08/23/2017 1,166 (H) <=0 IU/mL Final     Antinuclear Antibodies (ANA)   Date Value Ref Range Status   08/23/2017 Positive (A) Negative Final   09/02/2012 POSITIVE (AB) NEGATIVE Final     ASSESSMENT AND PLAN:     Erica Jenkins is a 32 y.o. woman with history significant for bipolar disorder, chronic hepatitis C, migraine disorder, pelvic inflammatory disease, rheumatoid arthritis, and polysubstance abuse in remission who presents for chronic hepatitis C.    She has low risk for advanced firbosis, lacking other exposures as in HPI, having disease for a short period of time, and lacking symptoms of physical exam findings of chronic liver disease on exam. We will further stratify with Fibroscan.    She is an excellent candidate for eradication therapy, with an agent effective against genotype 3. Her only medication issue is sulfasalazine, which her rheumatologist kindly held until we can finish treatment.    She is immune to hepatitis B, unsure about hepatitis A, we will check vaccination status today.    Patient Instructions and Plan   You have chronic hepatitis C, a virus that can hurt the liver over time. You do not have risk factors for it to have done very much damage to the liver. We can cure your hepatitis C with a medication.  1. We will treat your hepatitis C with Mavyret for eight weeks, which has minimal side effects and is 95% effective.  Our pharmacist will help [place] to get the medicine, and will give you instructions to call her when you receive it in the mail.  2. We would wait to start your sulfasalazine until you finish the hepatitis C treatment.  3. We will check you are immune ot hepatitis A. If you are not, we will give you the first vaccine at your follow-up visit.  4. We will check you liver labs today.  5. We will check a Fibroscan, which is a quick test to measure if there is any scarring or long-tern injury to the liver from the hepatitis C.    Return when you receive medication and call per pharmacists instructions.    Erica Ee, MD MPH  Gastroenterology Fellow     The patient was seen and discussed with Dr. Piedad Climes, who agreed with the above assessment and plan.    Attending physician:  I have seen and examined this patient and discussed their management with the fellow and with the patient.  I agree with the recommendations outlined above. Patient with chronic HCV genotype 3, low viral load, treatment naive. Minimal fibrosis on Fibroscan. Will plan to treat with 8 weeks of Mavyret via MAP. Vaccinated for HBV 1998. Needs HAV vaccination.  Burt Piatek M. Piedad Climes, MD  [facility]

## 2017-12-02 NOTE — Unmapped (Signed)
Counseling for HCV treatment     B18.2 Hep C: yes    K74.60 Cirrhosis: no,   Child Pugh Score if applicable and for Medicaid pts: n/a  Z94.4 Liver Transplant: no    Genotype: 3 (08/23/17)  HCV RNA: 1,166 IU/ml on 08/23/17   Fibrosis score: F1-2 (6.4 kPa) on Fibroscan on 12/02/17  HIV Co-infection? no  Signs of liver decompensation? no  Previous treatment? naive    Planned regimen: Mavyret (glecaprevir/pibrentasvir 100/40 mg) 3 tabs daily x 8 weeks  Urgency: Routine Request    Prescribing Provider/NPI: Dr. Chip Boer / [id number]  Signature waiver form not obtained at this time.  Insurance: PAP approved    Erica Jenkins is 32 y.o. Caucasian female who presents to clinic alone and is interested in starting treatment with Mavyret. We discussed the authorization process of obtaining the medication through MP[employer] and that this may take some time.  Stressed importance of being able to be reached by phone.        Current medications:  Current Outpatient Prescriptions   Medication Sig Dispense Refill   ??? acetaminophen (ARTHRITIS PAIN RELIEF, ACETAM,) 650 MG CR tablet Take 650 mg by mouth every eight (8) hours as needed for pain.     ??? amLODIPine (NORVASC) 5 MG tablet Take 1 tablet (5 mg total) by mouth daily. 90 tablet 0   ??? clonazePAM (KLONOPIN) 1 MG tablet Take 1 mg by mouth Three (3) times a day.     ??? clonazePAM (KLONOPIN) 1 MG tablet Take 1 mg by mouth Three (3) times a day.     ??? diphenhydrAMINE (BENADRYL) 25 mg capsule/tablet Take 25 mg by mouth every six (6) hours as needed for itching.     ??? divalproex (DEPAKOTE) 125 MG DR tablet Take 1 tablet (125 mg total) by mouth Three (3) times a day. 180 tablet 0   ??? docusate sodium (COLACE) 100 MG capsule Take 100 mg by mouth daily.     ??? FLUoxetine (PROZAC) 20 MG tablet Take 1 tablet (20 mg total) by mouth daily. 90 tablet 0   ??? ibuprofen (ADVIL,MOTRIN) 200 MG tablet Take 200 mg by mouth every six (6) hours as needed for pain.     ??? linaclotide (LINZESS) 145 mcg capsule Take 1 capsule (145 mcg total) by mouth daily. 30 capsule 0   ??? meloxicam (MOBIC) 15 MG tablet Take 1 tablet (15 mg total) by mouth daily. 90 tablet 3   ??? omeprazole (PRILOSEC) 40 MG capsule Take 1 capsule (40 mg total) by mouth daily. (Patient taking differently: Take 40 mg by mouth Two (2) times a day. ) 30 capsule 1   ??? oxyCODONE (OXY-IR) 5 mg capsule Take 5 mg by mouth Every six (6) hours.     ??? predniSONE (DELTASONE) 10 MG tablet Take 1 tablet (10 mg total) by mouth daily. 30 tablet 3   ??? promethazine (PHENERGAN) 25 MG tablet Take 25 mg by mouth every six (6) hours as needed for nausea.           Following topics were discussed during counseling:    1. Indications for medication, dosage and administration.     A. Mavyret (100/40 mg) 3 tablets to take daily with food.    2. Common side effects of medications and management strategies. (fatigue, headache)     A. Pregnancy Precaution: Pt reports s/p tubal ligation in 2010.    3. Importance of adherence to regimen, follow-up clinic visits and lab monitoring.  A. Asked patient to call [facility] [phone] to establish start date for treatment and to schedule appointment 4 weeks before starting treatment.    4. Drug-drug interaction.    A. Current medications have been reviewed and assessed for possible interaction. Denies use of herbal medication such as milk thistle or [place]. John's wart.  Allergies have been verified. Denies alcohol.    5. Importance of informing pharmacy and clinic of updated contact information.  Discussed the process of obtaining medication through specialty pharmacy and when approved medication will be delivered to patient's home. Stressed importance of being able to be reached over the phone.    Patient verbalized understanding. Provided contact information for any questions/concerns.       Park Breed, Pharm D., BCPS, BCGP, CPP  [facility]  [address]  [place], [place] [address]  [phone]      This portion of the visit was 15 minutes in duration and greater than 50% was spend in direct counseling and coordination of care regarding hepatitis C medication management.

## 2017-12-02 NOTE — Unmapped (Signed)
[facility]  FAST ??? Fibrosis Assessment Team  Division of Gastroenterology and Hepatology  ??  ??    ??  FIBROSCAN will be performed to assess hepatic fibrosis (scarring) in order to stage this patient's liver disease. This will assist with evaluating the natural course of the disease and will provide important information regarding prognosis, duration of therapy, and potential response to treatment. This information will also help assess risk for hepatocellular carcinoma and need for liver cancer surveillance.??  ??  FibroscanProcedure:   After obtaining verbal consent, the patient was placed in a supine position. Physical characteristics and landmarks were assessed to establish appropriate mid-axillary intercostal space for probe placement. 50Hz  Shear Wave pulses were applied and the resulting Shear Wave and Propagation Speed detected with a 3.5 MHz ultrasonic signal, using the FibroScan probe.  Skin to liver capsule distance and liver parenchyma were accessed during the entire examination with the FibroScan probe. The patient was instructed to breathe normally and to abstain from sudden movements during the procedure, allowing for random measurements of liver stiffness. At least ten Shear Waves were produced; individual measurements of each Shear Wave were calculated. Patient tolerated the procedure well and was discharged without incident.  ??  Probe used [x]  M+    Serial # [id number]                         []  XL+   Serial # [id number]  ??  Main Etiology of Liver Disease:  [x] HCV     [] HBV   [] Alcohol    [] NASH  [] PBC      [] PSC     [] Other________________  ??  50Hz  shear wave pulses were applied and the resulting shear wave and propagation speed detected with a 3.5 MHz ultrasonic signal, using the FibroScan probe.     ??  At least ten Shear Waves were produced; individual measurements of each shear wave were calculated.    ??  Patient tolerated the procedure well and was discharged without incident.  ??  Fibroscan score: ___6.4___kPa  ??  IQR:                       ___3___%  ??  Test performed by: Luiz Ochoa, RN  ??  [facility]  FAST ??? Fibrosis Assessment Team  Division of Gastroenterology and Hepatology  ??  ??    ??  ??  ??  Estimation of the stage of liver fibrosis (Metavir Score):  The results of the Liver Stiffness Score are consistent with the following liver fibrosis stage:  ??  ??  []  F0-F1             []  F2               []  F3               []  F4  ??  ??  GENERAL RECOMMENDATIONS ACCORDING TO THE STAGE OF LIVER FIBROSIS.  ??  F0-F1: No-minimal fibrosis. The risk of progression to advanced fibrosis and cirrhosis is low. If the cause of liver disease is not removed, a 1-2 yr follow-up study is recommended.   F2: Significant fibrosis. There is a moderate risk of progression to cirrhosis. If the cause of liver disease is not removed, a follow-up study in 12 months is recommended.  F3: Advanced (pre-cirrhotic stage). The risk of progression to cirrhosis is high. Imaging studies to rule out hepatocellular carcinoma  should be considered. Efforts to remove the cause of liver disease are highly recommended.  F4: Cirrhosis. There is significant risk of portal hypertension and esophageal varices. An upper endoscopy is recommended. Imaging studies for hepatocellular carcinoma screening are recommended.   ??  Any and all FibroScan studies must be carefully evaluated, taking fully into account all individual measurement/scans, patient history and other factors.  As with liver biopsy, any estimation of liver fibrosis may be subject to under or over staging due to sampling error.  Any further medical or surgical intervention should be made only while fully considering the circumstances of this patient and in consultation with this patient.    I have reviewed and interpreted the FIBROSCAN test results as described above.

## 2017-12-02 NOTE — Unmapped (Addendum)
You have chronic hepatitis C, a virus that can hurt the liver over time. You do not have risk factors for it to have done very much damage to the liver. We can cure your hepatitis C with a medication.  1. We will treat your hepatitis C with Mavyret for eight weeks, which has minimal side effects and is 95% effective. Our pharmacist will help [place] to get the medicine, and will give you instructions to call her when you receive it in the mail.  2. We would wait to start your sulfasalazine until you finish the hepatitis C treatment.  3. We will check you are immune ot hepatitis A. If you are not, we will give you the first vaccine at your follow-up visit.  4. We will check you liver labs today.  5. We will check a Fibroscan, which is a quick test to measure if there is any scarring or long-tern injury to the liver from the hepatitis C.

## 2017-12-03 LAB — HEPATITIS A IGG: Hepatitis A virus Ab.IgG:PrThr:Pt:Ser:Ord:: NONREACTIVE

## 2017-12-06 MED ORDER — GLECAPREVIR 100 MG-PIBRENTASVIR 40 MG TABLET
Freq: Every day | ORAL | 1 refills | 0.00000 days | Status: CP
Start: 2017-12-06 — End: 2018-03-28

## 2017-12-06 MED ORDER — GLECAPREVIR 100 MG-PIBRENTASVIR 40 MG TABLET: 3 | tablet | 1 refills | 0 days

## 2017-12-10 ENCOUNTER — Ambulatory Visit: Admit: 2017-12-10 | Discharge: 2017-12-11

## 2017-12-10 DIAGNOSIS — Z0289 Encounter for other administrative examinations: Secondary | ICD-10-CM

## 2017-12-10 DIAGNOSIS — B1710 Acute hepatitis C without hepatic coma: Secondary | ICD-10-CM

## 2017-12-10 DIAGNOSIS — M069 Rheumatoid arthritis, unspecified: Secondary | ICD-10-CM

## 2017-12-10 DIAGNOSIS — G894 Chronic pain syndrome: Principal | ICD-10-CM

## 2017-12-10 DIAGNOSIS — D279 Benign neoplasm of unspecified ovary: Secondary | ICD-10-CM

## 2017-12-10 HISTORY — DX: Encounter for other administrative examinations: Z02.89

## 2017-12-10 LAB — TOXICOLOGY SCREEN, URINE
AMPHETAMINE SCREEN URINE: <500
CANNABINOID SCREEN URINE: <20
COCAINE(METAB.)SCREEN, URINE: <150
METHADONE SCREEN, URINE: <300
OPIATE SCREEN URINE: <300

## 2017-12-10 LAB — OPIATE SCREEN URINE: Lab: <300

## 2017-12-10 NOTE — Unmapped (Signed)
[facility]    Assessment/Plan:     1. Chronic pain syndrome    2. Rheumatoid arthritis, involving unspecified site, unspecified rheumatoid factor presence (CMS-HCC)    3. Benign neoplasm of ovary, unspecified laterality    4. SIGNED MEDICATION CONTRACT WITH  INTERNAL MEDICINE    5. Acute hepatitis C virus infection without hepatic coma      Patient's pain is less than adequately controlled at present. Patient's mental status is fair.     Plan   1.  Medication contract signed with [facility].  2.  No medications were prescribed today.  3.  A urine toxicology screen was performed.  4.  Return to clinic to see Dr. Octavio Manns in approximately 2 weeks.    Subjective   REASON FOR VISIT:   Introductory visit for enrollment into the [facility].    HISTORY OF PRESENT ILLNESS:    Erica Jenkins is a 32 y.o. female with a history of  Patient Active Problem List   Diagnosis   ??? Benign neoplasm of ovary   ??? Herpes simplex   ??? Lichenification and lichen simplex chronicus   ??? Lichen sclerosus of female genitalia   ??? Rheumatoid arthritis (CMS-HCC)   ??? Vaginitis and vulvovaginitis   who presents today for an enrollment visit into the [facility].     MEDICATION USE:  MEDICATION CONTRACT:   Medication management agreement on file:  yes.    REPORTED PAIN REGIMEN:  Patient is currently taking:  -- Oxycodone 5 mg prn. She primarily takes this every few weeks as she tries to avoid pain medication usage unless completely necessary.   -- Tylenol Extra Strength 500 mg twice daily     EFFECTIVENESS OF ANALGESIC MEDICATION REGIMEN:    Patient reports fair analgesia from the current medication regimen.  Patient reports that the current medications do help to improve the quality of life and level of function.  Patient reports being able to tolerate the current pain medications well.  Patient reports being able to perform the majority of ADLs on the current medication regimen.    Adverse Effects of Analgesic Medications: Constipation:  No. However, she does use a stool softener regularly.  Sedation:  no.  Cognitive impairment:  no.    Most recent dose of Oxycodone 5 mg was taken two days ago; patient has 12 tablets remaining.    Last urine toxicology screen: never.    [place] Controlled Substance Reporting System was reviewed for this patient today.  [facility] Department of Corrections was reviewed for this patient today.  Previous Compliance Concerns: N/A    Patient did bring prescription bottles for pill counts today.  Pill count indicates use consistent with the prescribed dose.  The patient's current medication list, including OTC and complementary medications, was reviewed today.    SOCIAL HISTORY:  ALCOHOL USE: Patient currently does not drink alcoholic beverages.  SMOKING STATUS: Patient currently does smoke at 1/2 pack per day cigarettes. Patient does not smoke marijuana.    RECREATIONAL DRUGS: Patient does not use recreational drugs.   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   CURRENTLY EMPLOYED:  Yes.    PAIN:  PAIN GENERATOR 1: Joint pain attributed to RA  Location:  Shoulders, knees, wrist  Character:  nauseating, pressing, sore, tender, tingling, tight and nagging  Frequency:  constant with flares  Pain is worst in:  mornings, evenings and with weather changes  Pain negatively affects:  enjoyment of life, general activity, mood, normal work, recreational activities, relationships with people, sleep, walking, sitting and standing     PAIN GENERATOR 2:   Location: Back pain  Character: Twisting, sharp  Frequency:  intermittent   Pain is worst in: Primarily at night  Pain negatively affects:  sleep    PAIN GENERATOR 3: Migraines  Location:  Forehead and back of head  Character:  Shooting, pressure  Frequency: daily    Pain is worst in:  Not worst during a specific time of day  Pain negatively affects: Relationships, work.     PAIN GENERATOR 4: Ovarian Cyst, Pelvis inflammatory disease (per patient, is not currently on problem list)  Location:  Lower abdomen & pelvic region  Character:  Sharp, stabbing  Frequency: ~ Monthly. When pain occurs, it will last for several days to a full week. Pain will last 2-5 minutes at a time.   Pain is worst in:  Not worst during a specific time of day  Pain negatively affects:  Relationships, work.     PAIN ASSESSMENT:    1.  What number best describes your PAIN ON AVERAGE in the past week?  7/10  2.  What number best describes how, during the past week, pain has interfered with your ENJOYMENT OF LIFE?  9/10  3.  What number best describes how, during the past week, pain has interfered with your GENERAL ACTIVITY?  10/10  4.  What number best describes your CURRENT LEVEL OF PAIN?  7/10    MOOD ASSESSMENT:  Patient reports that since the last visit:  Concentration: Average  Energy: Fair  Appetite: Average  Sleep:  3-5 hours  Mood: Poor      PHYSICAL EXAMINATION:    General:  Well appearing, in no apparent physical distress. Patient's weight was not obtained today.  Patient was accompanied by spouse.   Psychiatric:  Patient's mood today was agitated. Patient self-described the mood as poor. No evidence of sedation. No overt pain behaviors were observed.  Cognition, insight, and judgment were intact.  Affect was  congruent with the stated mood.      Despina Hick, Care Management Assistant        Tollie Pizza. Octavio Manns, Ilda Basset.D., M.P.H., CPP

## 2017-12-18 NOTE — Unmapped (Signed)
Patient called back and I offered her a [facility] appointment for today and tomorrow but she states she is unable to get here until the middle of March. She was hoping something could be done over the phone. Denies any thoughts of self-harm or harming of others. She can be reached at [phone]

## 2017-12-18 NOTE — Unmapped (Signed)
Left a message on patients phone to call back to offer her a [facility] appointment

## 2017-12-20 LAB — OPIATE, URINE, QUANTITATIVE
6-MONOACETYLMRPH: <20 ng/mL
BUPRENORPHINE: <5 ng/mL
HYDROCODONE GC/MS CONF: <50 ng/mL
HYDROMORPHONE GC/MS CONF: <50 ng/mL
MORPHINE GC/MS CONF: <50 ng/mL
NORBUPRENORPHINE: <5 ng/mL
OXYCODONE (GC/MS): <50 ng/mL

## 2017-12-20 LAB — NORBUPRENORPHINE: Lab: <5

## 2017-12-24 ENCOUNTER — Ambulatory Visit: Admit: 2017-12-24 | Discharge: 2017-12-24

## 2017-12-24 DIAGNOSIS — G43109 Migraine with aura, not intractable, without status migrainosus: Secondary | ICD-10-CM

## 2017-12-24 DIAGNOSIS — M05761 Rheumatoid arthritis with rheumatoid factor of right knee without organ or systems involvement: Principal | ICD-10-CM

## 2017-12-24 DIAGNOSIS — M05762 Rheumatoid arthritis with rheumatoid factor of left knee without organ or systems involvement: Secondary | ICD-10-CM

## 2017-12-24 HISTORY — DX: Migraine with aura, not intractable, without status migrainosus: G43.109

## 2017-12-24 LAB — TOXICOLOGY SCREEN, URINE
AMPHETAMINE SCREEN URINE: <500
BARBITURATE SCREEN URINE: <200
BENZODIAZEPINE SCREEN, URINE: <200
COCAINE(METAB.)SCREEN, URINE: <150
METHADONE SCREEN, URINE: <300

## 2017-12-24 LAB — OPIATE SCREEN URINE: Lab: <300

## 2017-12-24 MED ORDER — DICLOFENAC 1 % TOPICAL GEL
Freq: Four times a day (QID) | TOPICAL | 5 refills | 0.00000 days | Status: CP
Start: 2017-12-24 — End: 2018-02-20

## 2017-12-24 MED ORDER — CLONAZEPAM 1 MG TABLET
ORAL_TABLET | Freq: Three times a day (TID) | ORAL | 1 refills | 0.00000 days | Status: CP
Start: 2017-12-24 — End: 2017-12-24

## 2017-12-24 MED ORDER — CLONAZEPAM 1 MG TABLET: 1 mg | tablet | Freq: Three times a day (TID) | 1 refills | 0 days | Status: AC

## 2017-12-24 MED ORDER — TOPIRAMATE 25 MG TABLET
ORAL_TABLET | 3 refills | 0.00000 days | Status: CP
Start: 2017-12-24 — End: 2018-03-31

## 2017-12-24 MED ORDER — TOPIRAMATE 25 MG TABLET: tablet | 3 refills | 0 days | Status: AC

## 2017-12-24 MED ORDER — DICLOFENAC 1 % TOPICAL GEL: 2 g | g | Freq: Four times a day (QID) | 5 refills | 0 days | Status: AC

## 2017-12-24 NOTE — Unmapped (Signed)
[facility]    Assessment/Plan:     1. Rheumatoid arthritis involving both knees with positive rheumatoid factor (CMS-HCC)    2. Migraine with aura and without status migrainosus, not intractable      Patient's pain is less than adequately controlled at present. Patient's mental status is fair.     Plan   1.  A urine toxicology screen was performed today.  2.  Add diclofenac 1% gel, apply QID to affected joints, #100 grams, 5 refills.  3.  Add topiramate 25 mg, start 1 PO QHS for 5 nights, then 1 PO BID for 5 days, then 1 PO QAM and 2 PO QHS for 5 days, then 2 PO BID thereafter, #120, 3 refills.    4.  A total of 45 minutes was spent face-to-face with the patient, over half of which involved counseling.  5.  Follow-up: Return in 8 weeks (on 02/18/2018).    Future Appointments  Date Time Provider Department Center   12/25/2017 8:45 AM Dow Adolph Ruminjo, MD [facility]   01/14/2018 8:00 AM Ebbie Latus, LCSW [facility]   01/14/2018 8:50 AM Rosary Lively, MD [facility]   01/16/2018 9:30 AM Michel Bickers, MD [facility]   01/30/2018 11:45 AM Otho Perl, MD [facility]   02/04/2018 9:00 AM April Armstead Peaks, [facility]       Subjective   REASON FOR VISIT:  Re-assessment and management of chronic pain.    HISTORY OF PRESENT ILLNESS:    Erica Jenkins is a 32 y.o. female with a history of rheumatoid arthritis and migraine headaches who presents today for a pain management visit.  Since the last visit for pain management, the pain is unchanged.  Patient was last seen in this clinic never: first visit.  She reports ovarian cyst at age 19, with migraine onset in 2006, when she was 65.  She is smoking a half-pack daiy and states that she wants to quit.  She has three pre-teens at home.  She also mentioned night terrors, secondary to PTSD, and she will discuss it with Dr. Lucile Shutters tomorrow. SOCIAL HISTORY:     Social History     Social History   ??? Marital status: Legally Separated     Spouse name: N/A   ??? Number of children: N/A   ??? Years of education: N/A     Occupational History   ??? Not on file.     Social History Main Topics   ??? Smoking status: Current Every Day Smoker     Packs/day: 0.50   ??? Smokeless tobacco: Never Used   ??? Alcohol use No   ??? Drug use: No   ??? Sexual activity: Not on file     Other Topics Concern   ??? Not on file     Social History Narrative   ??? No narrative on file     ALCOHOL USE: Patient currently does not drink alcoholic beverages.  SMOKING STATUS: Patient currently does smoke at 1/2 pack per day cigarettes. Patient does not smoke marijuana.  RECREATIONAL DRUGS: Patient does not use recreational drugs.    OCCUPATION:  Restaurant manager, fast food.  CURRENTLY EMPLOYED:  Yes.    MEDICATION USE:  MEDICATION CONTRACT:   Medication management agreement on file:  yes.    EFFECTIVENESS OF ANALGESIC MEDICATION REGIMEN:    Patient reports fair overall analgesia from the current medication regimen.  Patient reports  that the current medications do help to improve the quality of life and level of daily function.  Patient reports being able to tolerate the current pain medications well.  Patient reports being able to perform the majority of ADLs on the current medication regimen.    Most recent dose of oxycodone IR was taken greater than one week ago; patient has 11 tablets remaining.    Last urine toxicology screen: two weeks ago.  The last urine toxicologic screen was not appropriate.    [place] Controlled Substance Reporting System was reviewed for this patient today.  [facility] Department of Corrections was not reviewed for this patient today.  Previous Compliance Concerns: N/A    Current total morphine mg equivalents (MME):  0  Reason for > 90 mg MME: N/A  Naloxone ordered: no.  Current use of benzodiazepine: no    The patient's current medication list, including prescription, OTC and complementary medications, was reviewed today.  Patient did bring prescription bottles for pill/patch counts today.    Medication (i.e., prescription vial) was not returned for disposal today.  Pill/patch count indicates that use is consistent with the prescribed dose.    Patient was advised to present to the next visit with all medication containers available for a pill and/or patch count.     Adverse Effects of Analgesic Medications:  Constipation:  no.  Sedation:  no.  Cognitive impairment:  no.    PAIN:  PAIN GENERATOR 1: Joint pain attributed to RA  Location:  Shoulders, knees, wrist  Character:  nauseating, pressing, sore, tender, tingling, tight and nagging  Frequency:  constant with flares  Pain is worst in:  mornings, evenings and with weather changes  Pain negatively affects:  enjoyment of life, general activity, mood, normal work, recreational activities, relationships with people, sleep, walking, sitting and standing   ??  PAIN GENERATOR 2:   Location: Back pain  Character: Twisting, sharp  Frequency:  intermittent   Pain is worst in: Primarily at night  Pain negatively affects:  sleep  ??  PAIN GENERATOR 3: Migraines  Location:  Forehead and back of head  Character:  Shooting, pressure  Frequency: daily    Pain is worst in:  Not worst during a specific time of day  Pain negatively affects: Relationships, work.     PAIN GENERATOR 4: Ovarian Cyst, Pelvis inflammatory disease (per patient, is not currently on problem list)  Location:  Lower abdomen & pelvic region  Character:  Sharp, stabbing  Frequency: ~ Monthly. When pain occurs, it will last for several days to a full week. Pain will last 2-5 minutes at a time.   Pain is worst in:  Not worst during a specific time of day  Pain negatively affects:  Relationships, work.     PAIN ASSESSMENT:    1.  What number best describes your PAIN ON AVERAGE in the past week?  7/10  2.  What number best describes how, during the past week, pain has interfered with your ENJOYMENT OF LIFE?  5/10  3.  What number best describes how, during the past week, pain has interfered with your GENERAL ACTIVITY?  5/10  4.  What number best describes your CURRENT LEVEL OF PAIN?  7/10    MOOD ASSESSMENT:  Patient reports that since the last visit:  Concentration: Fair  Energy: Poor  Appetite: Fair  Sleep:  3-5 hours  Mood: Fair; swings    REVIEW OF SYSTEMS:  An 11-point ROS is negative  except what is noted above in HPI or listed below   Musculoskeletal ROS: positive for - pain in back - generalized, lower, upper and knee - bilateral    Objective   PAST MEDICAL HISTORY:    Active Ambulatory Problems     Diagnosis Date Noted   ??? Benign neoplasm of ovary 10/02/2012   ??? Herpes simplex 02/20/2013   ??? Lichenification and lichen simplex chronicus 01/06/2013   ??? Lichen sclerosus of female genitalia 11/17/2012   ??? Rheumatoid arthritis (CMS-HCC) 10/02/2012   ??? Vaginitis and vulvovaginitis 02/20/2013   ??? SIGNED MEDICATION CONTRACT WITH [facility] INTER[facility]ICINE 12/10/2017   ??? Bipolar 1 disorder, depressed, moderate (CMS-HCC) 02/04/2000   ??? PTSD (post-traumatic stress disorder) 03/22/2017   ??? Genital herpes 05/26/2010   ??? Hepatitis C 04/15/2016   ??? Lichen sclerosus 04/26/2006   ??? Ovarian cyst 02/04/2012   ??? Pelvic inflammatory disease 03/15/2010     Resolved Ambulatory Problems     Diagnosis Date Noted   ??? No Resolved Ambulatory Problems     Past Medical History:   Diagnosis Date   ??? Bipolar disease, chronic (CMS-HCC)    ??? Chronic hepatitis C (CMS-HCC)    ??? Migraines    ??? Pelvic inflammation in female    ??? Rheumatoid arthritis (CMS-HCC)    ??? 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 Tobacco abuse      Current Outpatient Prescriptions   Medication Sig Dispense Refill   ??? acetaminophen (ARTHRITIS PAIN RELIEF, ACETAM,) 650 MG CR tablet Take 650 mg by mouth every eight (8) hours as needed for pain.     ??? amLODIPine (NORVASC) 5 MG tablet Take 1 tablet (5 mg total) by mouth daily. 90 tablet 0   ??? clobetasol (TEMOVATE) 0.05 % ointment Apply nightly to affected are on the vulva. Use only a pea sized amount. (Patient not taking: Reported on 12/02/2017) 15 g 1   ??? clonazePAM (KLONOPIN) 1 MG tablet Take 1 mg by mouth Three (3) times a day.     ??? clonazePAM (KLONOPIN) 1 MG tablet Take 1 mg by mouth Three (3) times a day.     ??? diphenhydrAMINE (BENADRYL) 25 mg capsule/tablet Take 25 mg by mouth every six (6) hours as needed for itching.     ??? divalproex (DEPAKOTE) 125 MG DR tablet Take 1 tablet (125 mg total) by mouth Three (3) times a day. 180 tablet 0   ??? docusate sodium (COLACE) 100 MG capsule Take 100 mg by mouth daily.     ??? doxycycline (DORYX) 100 MG EC tablet Take 100 mg by mouth Two (2) times a day.     ??? FLUoxetine (PROZAC) 20 MG tablet Take 1 tablet (20 mg total) by mouth daily. 90 tablet 0   ??? glecaprevir-pibrentasvir (MAVYRET) 100-40 mg tablet Take 3 tablets by mouth daily. Take with food. 1 4-week carton 1   ??? hydroxychloroquine (PLAQUENIL) 200 mg tablet Take 1 tablet (200 mg total) by mouth Two (2) times a day. (Patient not taking: Reported on 12/02/2017) 60 tablet 4   ??? ibuprofen (ADVIL,MOTRIN) 200 MG tablet Take 200 mg by mouth every six (6) hours as needed for pain.     ??? linaclotide (LINZESS) 145 mcg capsule Take 1 capsule (145 mcg total) by mouth daily. 30 capsule 0   ??? meloxicam (MOBIC) 15 MG tablet Take 1 tablet (15 mg total) by mouth daily. 90 tablet 3   ??? omeprazole (PRILOSEC) 40 MG capsule Take 1  capsule (40 mg total) by mouth daily. (Patient taking differently: Take 40 mg by mouth Two (2) times a day. ) 30 capsule 1   ??? oxyCODONE (OXY-IR) 5 mg capsule Take 5 mg by mouth Every six (6) hours.     ??? predniSONE (DELTASONE) 10 MG tablet Take 1 tablet (10 mg total) by mouth daily. 30 tablet 3   ??? promethazine (PHENERGAN) 25 MG tablet Take 25 mg by mouth every six (6) hours as needed for nausea.     ??? sulfaSALAzine (AZULFIDINE) 500 MG EC tablet Take 2 tablets (1,000 mg total) by mouth Two (2) times a day. (Patient not taking: Reported on 12/02/2017) 120 tablet 4     No current facility-administered medications for this visit.        PHYSICAL EXAMINATION:    Vitals:   Vitals:    12/24/17 0945   BP: 120/67   Pulse: 71   Weight: 69.1 kg (152 lb 4.8 oz)   Height: 157.5 cm (5' 2.01)     General:  Well appearing, of normal weight in mild apparent physical distress.  Patient's weight was obtained today.  Patient was not accompanied by anyone.  Patient is a good historian.    Musculoskeletal:  Patient ambulates without an assistive device.  Pain was produced with palpation at C4 to C4, and along the entire spine.  Pain produced with passive ROM of both knees, left greater than right, at both the medial and lateral aspects.  Strength was 5/5 throughout.  Extremities:  No clubbing, cyanosis, or edema was noted.  Spine:  Paraspinal tenderness to palpation was not present.  Joints:  Good range of motion of all extremities with joints all appearing to be within normal limits.    Neurologic:   Patient was alert and oriented times 3, with normal language, attention, cognition, and memory.  Patient's neurologic function was intact.  Reflexes were  2+ and symmetric in all four extremities.  Patient arises from a seated position with no difficulty.  Gastrointestinal: Patient does  report abdominal pain, and does  report stomach pain.    Psychiatric:  Patient's mood today was depressed. Patient self-described the mood as fair.  No evidence of sedation. No evidence of intoxication.   No overt pain behaviors were observed.  Cognition, insight, and judgment were intact.  Affect was congruent with the stated mood.  A PHQ-9 assessment was not performed.  A GAD-7 assessment was not performed.        Tollie Pizza. Octavio Manns, Ilda Basset.D., M.P.H., CPP
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
## 2017-12-24 NOTE — Unmapped (Addendum)
1.  Start applying the diclofenac 1% gel 4 times daily to all of the joints that hurt.    2.  Start the topiramate 25 mg when you receive it, as follows:    One at bedtime for 5 nights, then   1 tablet twice daily for 5 days, then   1 in the morning and 2 at night for 5 days, then   2 tablets twice daily thereafter.

## 2017-12-25 ENCOUNTER — Ambulatory Visit: Admit: 2017-12-25 | Discharge: 2017-12-26 | Attending: Psychosomatic Medicine | Primary: Psychosomatic Medicine

## 2017-12-25 DIAGNOSIS — G894 Chronic pain syndrome: Secondary | ICD-10-CM

## 2017-12-25 DIAGNOSIS — F411 Generalized anxiety disorder: Secondary | ICD-10-CM

## 2017-12-25 DIAGNOSIS — F4310 Post-traumatic stress disorder, unspecified: Secondary | ICD-10-CM

## 2017-12-25 DIAGNOSIS — F329 Major depressive disorder, single episode, unspecified: Principal | ICD-10-CM

## 2017-12-25 MED ORDER — AMLODIPINE 5 MG TABLET
ORAL_TABLET | ORAL | 3 refills | 0.00000 days | Status: CP
Start: 2017-12-25 — End: 2017-12-25

## 2017-12-25 MED ORDER — OMEPRAZOLE 20 MG CAPSULE,DELAYED RELEASE: 40 mg | capsule | 3 refills | 0 days

## 2017-12-25 MED ORDER — AMLODIPINE 5 MG TABLET: 5 mg | tablet | 3 refills | 0 days | Status: AC

## 2017-12-25 MED ORDER — OMEPRAZOLE 20 MG CAPSULE,DELAYED RELEASE
ORAL_CAPSULE | ORAL | 3 refills | 0.00000 days | Status: CP
Start: 2017-12-25 — End: 2017-12-25

## 2017-12-25 MED FILL — PREDNISONE/10MG/TABS: PREDNISONE/10MG/TABS | 30 days supply | Qty: 30 | Fill #1

## 2017-12-25 MED FILL — TOPIRAMATE/25MG/TABS: TOPIRAMATE/25MG/TABS | 30 days supply | Qty: 120 | Fill #0

## 2017-12-25 MED FILL — OMEPRAZOLE/20MG/CPDR: OMEPRAZOLE/20MG/CPDR | 90 days supply | Qty: 180 | Fill #0

## 2017-12-25 MED FILL — DICLOFENAC SODIUM/1%/GEL: DICLOFENAC SODIUM/1%/GEL | 30 days supply | Qty: 1 | Fill #0

## 2017-12-25 MED FILL — HYDROXYCHLOROQUINE SULFAT/200MG/TABS: HYDROXYCHLOROQUINE SULFAT/200MG/TABS | 90 days supply | Qty: 180 | Fill #0

## 2017-12-25 NOTE — Unmapped (Signed)
Initial Counseling for HCV treatment     Planned regimen: Mavyret (glecaprevir/pibrentasvir 100/40 mg) 3 tabs daily x 8 weeks  Planned start date: TBD, pending delivery    Pharmacy: [facility] [phone] option #1    PMH:   Past Medical History:   Diagnosis Date   ??? Bipolar disease, chronic (CMS-HCC)    ??? Chronic hepatitis C (CMS-HCC)    ??? Migraines    ??? Pelvic inflammation in female    ??? Rheumatoid arthritis (CMS-HCC)    ??? 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 Tobacco abuse      Current Meds: Will be starting Cymbalta (replacing fluoxetine), and prazosin per psych; Will start Voltaren gel per pain clinic. Started on topamax. Currently taking amlodipine, clonazepam, depakote, linzess, mobic, omeprazole, prednisone, hydroxychlorquine, phenergan prn, oxycodone prn.    Patient is ready to start Mavyret. However, given changes in her psych meds, instructed pt to call me once she receives her Mavyret before starting to check how she's tolerating her new psych meds.     Following topics were discussed during counseling:   Patient Counseling    Counseled the patient on the following:  doses and administration discussed, possible adverse effects and management discussed, possible drug and prescription drug interactions discussed, possible drug and OTC drug and food interactions discussed, lab monitoring and follow-up discussed, adherence and missed doses discussed, pharmacy contact information discussed        1. Indications for medication, dosage and administration.     A. Mavyret (100/40 mg) 3 tablets to take daily with food. Patient plans to take in the mornings with breakfast.     2. Common side effects of medications and management strategies. (fatigue, headache)      3. Importance of adherence to regimen, follow-up clinic visits and lab monitoring.   Medication Adherence    Demonstrates understanding of importance of adherence:  yes  Informant:  patient  Patient is at risk for Non-Adherence:  No  Reasons for non-adherence:  no problems identified  Confirmed plan for next specialty medication refill:  outside pharmacy       A. Asked patient to call [facility] [phone] to establish start date for treatment and to schedule appointment 4 weeks before starting treatment.      4. Drug-drug interaction.  Drug Interactions    Drug interactions evaluated:  yes  Clinically relevant drug interactions identified:  yes   Interactions list:  Prazosin - Prazosin is a substrate of BCRP and concentrations may increase due to inhibition of BCRP by Mavyret   Drug management plan:  Advised pt to monitor BP and of precautions for orthostatic hypotension.        A. Current medications have been reviewed and assessed for possible interaction.    Denies use of herbal medication such as milk thistle or [place]. John's wart.  Allergies have been verified.      5. Importance of informing pharmacy and clinic of updated contact information.  Stressed importance of being able to reach over the phone to set up refills. Advised patient to call pharmacy when down to about 7 day supply left to ensure there's no interruption in therapy.      Patient verbalized understanding. Provided contact information for any questions/concerns.       Park Breed, Pharm D., BCPS, BCGP, CPP  [facility]  [address]  [place], [place] [address]  [phone]      December 25, 2017 2:48 PM
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
## 2017-12-26 MED ORDER — PRAZOSIN 1 MG CAPSULE: 1 mg | capsule | 3 refills | 0 days

## 2017-12-26 MED ORDER — DULOXETINE 30 MG CAPSULE,DELAYED RELEASE
ORAL_CAPSULE | Freq: Every day | ORAL | 2 refills | 0 days | Status: CP
Start: 2017-12-26 — End: 2018-02-20

## 2017-12-26 MED ORDER — CLONAZEPAM 0.5 MG TABLET
ORAL_TABLET | Freq: Two times a day (BID) | ORAL | 0 refills | 0 days | Status: CP | PRN
Start: 2017-12-26 — End: 2018-02-20

## 2017-12-26 MED ORDER — HYDROXYZINE PAMOATE 25 MG CAPSULE
ORAL_CAPSULE | 2 refills | 0 days | Status: CP
Start: 2017-12-26 — End: 2018-03-28

## 2017-12-26 MED ORDER — PRAZOSIN 1 MG CAPSULE
ORAL_CAPSULE | Freq: Every evening | ORAL | 3 refills | 0.00000 days | Status: CP
Start: 2017-12-26 — End: 2018-01-27

## 2018-01-01 LAB — OPIATE, URINE, QUANTITATIVE
6-MONOACETYLMRPH: <20 ng/mL
CODEINE GC/MS CONF: <50 ng/mL
HYDROCODONE GC/MS CONF: <50 ng/mL
MORPHINE GC/MS CONF: <50 ng/mL
NORBUPRENORPHINE: <5 ng/mL
OXYCODONE (GC/MS): <50 ng/mL

## 2018-01-01 LAB — NORBUPRENORPHINE: Lab: <5

## 2018-01-01 MED FILL — CYMBALTA/30MG/CAP: CYMBALTA/30MG/CAP | 30 days supply | Qty: 30 | Fill #0

## 2018-01-08 MED ORDER — DIVALPROEX 125 MG CAPSULE,DELAYED RELEASE SPRINKLE: capsule | 3 refills | 0 days

## 2018-01-08 MED ORDER — DIVALPROEX 125 MG CAPSULE,DELAYED RELEASE SPRINKLE: 125 mg | capsule | Freq: Three times a day (TID) | 3 refills | 0 days | Status: AC

## 2018-01-08 MED ORDER — DIVALPROEX 125 MG CAPSULE,DELAYED RELEASE SPRINKLE
ORAL_CAPSULE | Freq: Three times a day (TID) | ORAL | 3 refills | 0.00000 days | Status: CP
Start: 2018-01-08 — End: 2018-07-03

## 2018-01-09 MED FILL — AMLODIPINE/5MG/TABS: AMLODIPINE/5MG/TABS | 90 days supply | Qty: 90 | Fill #0

## 2018-01-09 MED FILL — DIVALPROEX SPRINKLE/125MG/CPSP: DIVALPROEX SPRINKLE/125MG/CPSP | 60 days supply | Qty: 180 | Fill #0

## 2018-01-09 NOTE — Unmapped (Signed)
The Internal Medicine Enhanced Care team was unable to reach [facility] to follow-up in regards to their recent visit with our Depression Care Team. A voicemail was left with a request to return our call. This is our first attempt to contact patient.  Myrtis Hopping  Social Work Intern to  [employer], MSW, [place]  [phone]

## 2018-01-09 NOTE — Unmapped (Signed)
Patient was contacted by Graduate Social Work Intern Myrtis Hopping under supervision with me and in consultation as indicated.     Ferol Luz,  MSW, LCSW  Clinical Supervisor  [employer]

## 2018-01-10 NOTE — Unmapped (Signed)
Depression Care Program contacted the patient regarding last visit with PCP.     Recent PHQ-9 scores:  PHQ-9 10/28/2017 12/24/2017 12/25/2017   PHQ-9 TOTAL SCORE 13 22 23      Treatment plan per visit notes and medication list:  - she is not taking anything now. Her last dose of the cymbalta was 2 days ago. She has no side effects after coming off of it. She feels much improved overall, but she is still anxious.     She did some more thinking and she does not really want to go back on the prozac.      She cannot take zoloft as it has the same side effects as prozac. She has taken wellbutrin before, but cannot remember how it was.     Over the past two weeks, patient reports feeling better, but more paranoid.     Mental Health Follow-Up:   Patient has scheduled mental health follow up on 01/14/2018 with Anda Latina for the first visit.      Patient has a return attending visit with Dr. Altamease Oiler on 01/14/18 although patient has never seen her before. Patient said it doesn't matter who she sees and would like to keep the appointment. Dr. Dierdre Searles does have availability on 3/28. Patient abruptly said she had to go and asked to contact her via mychart. Will send message to her regarding moving appointment to 3/28.     A total of 8 minutes were spent talking with the patient.

## 2018-01-13 NOTE — Unmapped (Signed)
Spoke to patient. Informed her that the appointment with Dr. Altamease Oiler was made in error. She voiced understanding. Informed her it would be cancelled, but confirmed she still had an appointment with Anda Latina for counseling tomorrow. She voiced she would attend.     CA offered for her to see Dr. Dierdre Searles on Thursday, 3/28. Dr. Dierdre Searles hsd 9, 9:30, and 10 AM open. Patient has dermatology appointment at 9:30, but patient voiced she would prefer to see Dr. Dierdre Searles. She will call the dermatology clinic to reschedule her appointment with them and asked for the phone number to be sent to her via mychart. Confirmed patient would be attending appointments on Tuesday and Thursday.

## 2018-01-14 NOTE — Unmapped (Signed)
Hello, Called patient and left a message to call back.  Thank you
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
## 2018-01-14 NOTE — Unmapped (Signed)
Missed 1st scheduled [facility] Counseling Assessment    Patient didn't show for 1st scheduled [facility] Counseling assessment appointment.  Message to Rockney Ghee on this note to contact patient to reschedule.   May be useful to ask if she's contacted local counseling, recommended by Dr. [facility] 24 hour Crisis Line: [phone]  She's welcome to reschedule for here or take [facility] number to locate local counseling that may be low or no cost.    I've copied patient's primary.

## 2018-01-14 NOTE — Unmapped (Signed)
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 Patient stated she works during the day and does not have another safe place to ship the medication. Patient is going to call [employer] to see if they can ship it to her work address. Request made for patient to call the office when she receives her medication. Contact information provided.        Vertell Limber RN, BSN  Nursing Care Coordinator   Pharmacy Adult GI Medicine  [facility]  [address]   [place], [place] [address]  [phone]

## 2018-01-14 NOTE — Unmapped (Deleted)
Internal Medicine Initial Visit        Assessment/Plan:     Erica Jenkins was seen today for establish care.    Diagnoses and all orders for this visit:    Depression, unspecified depression type/Bipolar? Poorly controlled, with PHQ 16 today, but just recently started on Trintellix and Depakote a couple weeks ago per pt. Patient with low risk of suicidality per screener. Will not make any changes to meds today and f/u in 6 weeks to assess control of depression. Patient describes h/o bipolar diagnosis, and reports some symptoms that are c/w hypomania. Does not follow w/ psychiatrist.   -     vortioxetine (TRINTELLIX) 5 mg Tab; Take 2 tablets (10 mg total) by mouth daily at 0600.  -     divalproex (DEPAKOTE) 125 MG DR tablet; Take 1 tablet (125 mg total) by mouth Three (3) times a day.  -     Ambulatory referral to Psychiatry; Future  -     Ambulatory referral to Counseling; Future    Rheumatoid Arthritis   - f/u w/ rheumatology  - continue sulfasalazine, prednisone 10 mg qd     Chronic constipation  -     linaclotide (LINZESS) 145 mcg capsule; Take 1 capsule (145 mcg total) by mouth daily.    Gastroesophageal reflux disease, esophagitis presence not specified  -     omeprazole (PRILOSEC) 40 MG capsule; Take 1 capsule (40 mg total) by mouth daily.    Essential hypertension patient states she usually runs w/ SBP in 140s  -     Start amLODIPine (NORVASC) 5 MG tablet; Take 1 tablet (5 mg total) by mouth daily    Healthcare maintenance  -     Pap Smear      No Follow-up on file.    Patient was seen with Dr. Madelon Lips      Chief Complaint:      Erica Jenkins is a 32 y.o. female who presents for depression and to establish care.       Subjective:     HPI  Erica Jenkins is a 32 y.o. female with h/o lichen sclerosus, hepatitis C, RA who presents to establish care.     Regarding her RA, ***continues on Plaquenil, sulfasalazine, prednisone 10 mg qd, meloxicam 15 mg qd. ***PPSV-23     Regarding her hepatitis C, she was started on Mavyret ***     Regarding her bipolar depression, she was seen by psychiatry consult in clinic and was started on Cymbalta and prazosin ***   DCd Trintellix, still taking Depakote?   She has previously taken sertraline ***   Escitalopram?   She missed her counseling appointment.     The past medical history, surgical history, family history, social history, medications and allergies were reviewed in Epic.     Review of Systems    The balance of 10 systems was reviewed and unremarkable except as stated above.        Objective:     LMP 12/24/2017 (Exact Date)   General: NAD  HEENT: NCAT, EOMI   Cardiovascular: RRR, no m/r/g   Pulmonary: CTA, normal WOB   Abdomen: soft, nontender   Psych: flat affect

## 2018-01-25 NOTE — Unmapped (Signed)
Follow-Up Counseling for HCV Treatment      Regimen: Mavyret (glecaprevir/pibrentasvir 100/40 mg)  x 8 weeks  Start Date: 01/14/18  Completed Treatment Week #1    [facility]armacy: [phone], option #1    Following topics were reviewed during the phone call:  Patient Counseling    Counseled the patient on the following:  doses and administration discussed, possible adverse effects and management discussed, possible drug and prescription drug interactions discussed, possible drug and OTC drug and food interactions discussed, lab monitoring and follow-up discussed, therapeutic rationale discussed, adherence and missed doses discussed, pharmacy contact information discussed         1. Medication administration - Takes Mavyret 3 tabs at 7:30am every morning with breakfast.    2. Importance of adherence -   Medication Adherence    Patient reported X missed doses in the last month:  0  Specialty Medication:  Mavyret  Patient is on additional specialty medications:  No  Patient is on more than two specialty medications:  No  Any gaps in refill history greater than 2 weeks in the last 3 months:  no  Demonstrates understanding of importance of adherence:  yes  Informant:  patient  Reliability of informant:  reliable  Provider-estimated medication adherence level:  90-100%  Patient is at risk for Non-Adherence:  No  Reasons for non-adherence:  no problems identified  Adherence tools used:  alarm     Pt reports no missed doses, pill count over the phone revealed #16 day supply which is appropriate. Pt notes using an alarm to remind her of daily Mavyret dose.    3. Side effects -  Pt reports no side effects and is tolerating medication well.  Adverse Effects    *All other systems reviewed and are negative         4. Drug-drug interaction - When inquired, pt notes feeling slightly dizzy one night since starting Mavyret.  Pt reports taking BP medications including prazosin at night, and Mavyret in the morning.  Pt denies taking BP regularly, advised pt to monitor BP and to be watchful for any other instances of dizziness, and also light-headedness/confusion.  Pt verbalized understanding.  Pt reports no new medications or OTC products.  Pt says she is not taking Cymbalta due to side effects, and is also not taking fluoxetine at this time.   Drug Interactions    Drug interactions evaluated:  yes  Clinically relevant drug interactions identified:  yes   Interactions list:  Prazosin - Prazosin is a substrate of BCRP and concentrations may increase due to inhibition of BCRP by Mavyret   Drug management plan:  Advised pt to monitor BP and of precautions for orthostatic hypotension.   Provided the patient with educational material regarding drug interactions:  not applicable         5. Follow up - Has follow up appointment scheduled in HCV treatment clinic on 02/20/18 with Erica Shark, DNP.  Advised patient to call pharmacy when down to about 7 day supply left to ensure there's no interruption in therapy.      All questions were answered.  Pt verbalized understanding.     Erica Jenkins, PharmD Candidate  January 25, 2018 10:10 AM    I reviewed the findings with Pharmacy Student and agree with the findings and counseling as documented in the note.  Erica Jenkins, Pharm D., BCPS, BCGP, CPP  [facility]  [address]  [place], [place] [address]  [phone]

## 2018-01-27 MED ORDER — PRAZOSIN 1 MG CAPSULE
ORAL_CAPSULE | Freq: Every evening | ORAL | 3 refills | 0.00000 days | Status: CP
Start: 2018-01-27 — End: 2018-07-03

## 2018-01-27 MED ORDER — PRAZOSIN 1 MG CAPSULE: 2 mg | capsule | Freq: Every evening | 3 refills | 0 days | Status: AC

## 2018-01-30 ENCOUNTER — Ambulatory Visit: Admit: 2018-01-30 | Discharge: 2018-01-31

## 2018-01-30 DIAGNOSIS — M05761 Rheumatoid arthritis with rheumatoid factor of right knee without organ or systems involvement: Principal | ICD-10-CM

## 2018-01-30 DIAGNOSIS — B182 Chronic viral hepatitis C: Secondary | ICD-10-CM

## 2018-01-30 DIAGNOSIS — Z7189 Other specified counseling: Secondary | ICD-10-CM

## 2018-01-30 DIAGNOSIS — M05762 Rheumatoid arthritis with rheumatoid factor of left knee without organ or systems involvement: Secondary | ICD-10-CM

## 2018-01-30 MED ORDER — DICLOFENAC SODIUM 75 MG TABLET,DELAYED RELEASE: 75 mg | tablet | Freq: Two times a day (BID) | 11 refills | 0 days | Status: AC

## 2018-01-30 MED ORDER — CYCLOBENZAPRINE 5 MG TABLET: 5 mg | tablet | Freq: Every evening | 0 refills | 0 days | Status: AC

## 2018-01-30 MED ORDER — DICLOFENAC SODIUM 75 MG TABLET,DELAYED RELEASE: 75 mg | each | 11 refills | 0 days

## 2018-01-30 MED ORDER — CYCLOBENZAPRINE 5 MG TABLET
ORAL_TABLET | Freq: Every evening | ORAL | 0 refills | 0.00000 days | Status: CP
Start: 2018-01-30 — End: 2018-05-30

## 2018-01-30 MED ORDER — DICLOFENAC SODIUM 75 MG TABLET,DELAYED RELEASE
Freq: Two times a day (BID) | ORAL | 11 refills | 0.00000 days | Status: CP
Start: 2018-01-30 — End: 2018-01-30

## 2018-01-30 NOTE — Unmapped (Signed)
Last clinic visit: 10/24/2017    Accompanied by: alone     History of Present Illness:     HPI:  Erica Jenkins is a 32 y.o. female with a history of Hepatitis C who presents for follow up of Rheumatoid Arthritis (RA). Continues to have significant joint pain in her lower and upper back, neck, L>R knees.  Hands continue to get stiff - at times hard to open or close. AM stiffness for 30-60 minutes.  Started plaquenil 200 mg BID, has had some improvement.  Also on prednisone 10 mg daily.  Currently on mavyret for Hep C, 4 more weeks, GI asked her not to start sulfasalazine until after completed mavyret.  Using voltaren gel but unable to use at work due to strong odor. Remains on meloxicam 15 mg which she thinks is working some. Continues to have lots of pain.      Had baseline plaquenil eye exam Jan 2019 with Dr. Vernona Rieger [facility]).     Record review:  I have reviewed the patient's allergies, medications, pertinent past medical, surgical, social and family history and have updated in Epic where appropriate.     Review of Systems:   All other systems reviewed are negative except as per HPI.     Review of outside records: I have reviewed the outside records that were sent to me by the referring provider and these were scanned into the medical record. I have also reviewed the pertinent records including notes, labs, imaging tests in the medical record.     Objective    Physical Exam:  Vitals:    01/30/18 1136   BP: 115/67   BP Site: L Arm   BP Position: Sitting   BP Cuff Size: Medium   Pulse: 71   Temp: 36.5 ??C (97.7 ??F)   TempSrc: Oral   Weight: 68.5 kg (151 lb 0.2 oz)     Body mass index is 27.61 kg/m??.  GENERAL: The patient is well appearing, in no acute distress. Ambulates around exam room easily and climbs on exam table without difficulty.  SKIN:    No rashes, psoriasis, tophi, or subcutaneous nodules.  EYES: EOMI, PERRL. Sclera anicteric, conjunctiva non- injected.   ENT: mucus membranes moist without pharyngeal exudates or ulcers.   Neck: supple, no cervical lymphadenopathy, no thyromegaly  Respiratory: Breathing non-labored, CTA bilaterally, no wheezing, crackles, or rhonchi  CV: Heart rate regular, no murmurs  GI: Abdomen soft, nontender, nondistended, no hepatosplenomegaly  VASCULAR: warm and well perfused extremities, no c/c/e.  NEURO: CN 2-12 grossly intact.   PSYCH: No depression or anxiety. Cooperative. Alert and oriented.   MUSCULOSKELETAL:   ?? Neck with full ROM, stiff and painful all directions, very tight muscles TTP.  ?? Bilateral shoulders, elbows, wrists, hands, fingers:  No deformity, erythema, warmth, swelling, effusion, tenderness, or limited ROM.?? Grip strength good. Able to curl all fingers. Prayer sign negative.  Except: Synovitis and TTP PIP 2-5 bilaterally. Bilateral wrists with pain with flexion/extension. Bilateral elbows mild pain with extension. Upper and lower arms diffusely TTP.   ?? Spine diffusely tender to palpation paraspinal lumbar and thoracic region.   ?? Hips without limited ROM.????   ?? Bilateral knees, ankles, feet, toes: No deformity, erythema, warmth, swelling, effusion, tenderness, or limited ROM. MTP squeeze negative. Except: Bilat ankles with mild synovitis, no TTP. Upper and lower arms diffusely TTP.    Test Results:  10/24/2017 XR hands: Interval progression of right proximal carpal row joint space collapse. Small cortical erosion,  base of the right fifth proximal phalanx. ??Multifocal soft tissue swelling about the MCP and PIP joints, right greater than left hands. Findings appear most consistent with provided history of rheumatoid arthropathy.     10/24/2017 XR feet: Small nonspecific cortical lucencies within the left great toe and fifth metatarsal head suggestive of erosive arthropathy.    10/24/2017  CCP 8 (equivocal)  ESR 15, CRP <5.0    08/23/2017  RF 95    Assessment/Plan:     Erica Jenkins is a 32 y.o. female with a history of hepatitis C and polysubstance abuse first seen in Jan 2019 for joint pain, positive RF, noted to have significant synovitis on exam. CCP equivocal. Started on plaquenil (and sulfasalazine but did not start SSZ per GI recs), further RA treatment deferred until OK with GI. She has had significant improvement just on plaquenil so far, also on low dose prednisone.     Rheumatoid Arthritis (RA) Told pt that I am encouraged by the improvement she has already had on just plaquenil. Will touch base with GI about when it is safe to start DMARD.     Pt very frustrated by RA and many other symptoms, requesting refill on klonipin.  I recommended she be in touch with physician or provider who initially prescribed as I do not normally prescribe that medication. She missed an appt with psych, I recommended she call to reschedule and provided her the phone number.    Tight muscles. This may be exacerbated by RA. Recommended she perform stretches and ROM exercises. She is very uncomfortable and requesting medication. Agreed to one-time only short course of flexeril reviewing side effects including that it can be habit forming.     Possible amplified pain syndrome: Discussed with pt that she does seem to have pain in locations other than joints and is diffusely TTP which could point to an amplified pain syndrome such as fibromyalgia. Recommended focusing on treating inflammatory arthritis for now, but may need to revisit in the future to ensure if escalating medications we are treating active arthritis and not fibromyalgia. Briefly reviewed that my approach to fibromyalgia is not medication but rather focusing on good restorative sleep, physical activity, and addressing any mental health that may be present.    Immunization counseling:  Flu vaccine: Already completed Jan 2019  Pneumococcal vaccine: Prevnar PCV-13 completed Jan 2019.  Recommend pneumovax PPSV-23.     Contraception counseling: s/p bilateral tubal ligation.    Return in about 3 months (around 05/01/2018) for Follow-up RA.    Danella Maiers, MD, MSCI  Assistant Professor of Medicine  Department of Medicine/Division of Rheumatology  [place] [facility]  [phone] clinic phone  [phone] clinic secure fax    We appreciate the opportunity to participate in the care of this patient. Total duration of the visit was 40 minutes, with more than 50% of the visit spent in face-to-face counseling focusing on diagnosis and therapeutic options and coordinating care.      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  -     diclofenac (VOLTAREN) 75 MG EC tablet; Take 1 tablet (75 mg total) by mouth Two (2)  times a day.  -     Discontinue: cyclobenzaprine (FLEXERIL) 5 MG tablet; Take 1 tablet (5 mg total) by mouth nightly.  -     cyclobenzaprine (FLEXERIL) 5 MG tablet; Take 1 tablet (5 mg total) by mouth nightly.

## 2018-02-04 NOTE — Unmapped (Signed)
Attempted to call pt to let her know that I spoke to Dr. Piedad Climes last week and she is okay with starting a medicine to treat her rheumatoid arthritis without waiting to complete her hepatitis C treatment.  Asked patient to give [place] a call or send [place] a my chart message if she is interested in talking more about starting a medicine.    Of note Dr. Piedad Climes did recommend we select a medicine other than methotrexate at this time.  I would plan to start a TNF inhibitor like Humira at this time if patient is interested.  Will also send a my chart message.    Danella Maiers, MD, MSCI  February 04, 2018   2:38 PM

## 2018-02-06 MED ORDER — ADALIMUMAB 40 MG/0.8 ML SUBCUTANEOUS PEN KIT: each | 3 refills | 0 days

## 2018-02-06 MED ORDER — ADALIMUMAB 40 MG/0.8 ML SUBCUTANEOUS PEN KIT
SUBCUTANEOUS | 3 refills | 0.00000 days | Status: CP
Start: 2018-02-06 — End: 2018-02-06

## 2018-02-06 MED FILL — CYCLOBENZAPRINE HCL/5MG/TABS: CYCLOBENZAPRINE HCL/5MG/TABS | 10 days supply | Qty: 10 | Fill #0

## 2018-02-06 MED FILL — TOPIRAMATE/25MG/TABS: TOPIRAMATE/25MG/TABS | 30 days supply | Qty: 120 | Fill #1

## 2018-02-06 MED FILL — DICLOFENAC/75MG/TAB: DICLOFENAC/75MG/TAB | 30 days supply | Qty: 60 | Fill #0

## 2018-02-06 MED FILL — PREDNISONE/10MG/TABS: PREDNISONE/10MG/TABS | 30 days supply | Qty: 30 | Fill #2

## 2018-02-06 NOTE — Unmapped (Signed)
Social Work Intern contacted patient regarding depression treatment and remission follow-up. 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  A voicemail was not left with a request to return our call. This is our first attempt. Caller will follow up.

## 2018-02-07 NOTE — Unmapped (Signed)
Per test claim for Humira at the [facility], patient needs Medication Assistance Program for Manufacturer Assistance.

## 2018-02-10 NOTE — Unmapped (Signed)
Reached out to Ms. Erica Jenkins at the request of Dr. Sullivan Lone to provide Humira counseling.  Left voicemail requesting call back.  MAP team is currently working on obtaining mfg assistance for Humira.      Chelsea Aus

## 2018-02-11 MED ORDER — ADALIMUMAB PEN CITRATE FREE 40 MG/0.4 ML
PEN_INJECTOR | SUBCUTANEOUS | 3 refills | 0.00000 days | Status: CP
Start: 2018-02-11 — End: 2019-02-11

## 2018-02-11 MED ORDER — ADALIMUMAB PEN CITRATE FREE 40 MG/0.4 ML: pen | 3 refills | 0 days

## 2018-02-11 NOTE — Unmapped (Signed)
[facility] - Pharmacist Counseling Notes    Erica Jenkins is a 32 y.o. female being initiate on Humira for rheumatoid arthritis.  Patient will need mfg assistance through [employer].  MAP team is working on this.  Medication list, allergies and comorbidities reviewed:  appropriate to initiate therapy.       Regimen & Administration: Humira 40 mg subq once every 14 days.  Patient reported hives with Enbrel and is worried about similar reactions with Humira.  She does not have latex allergy.  Informed patient that we can use Humira Citrate Free formulation.  Will send in new script.    Administer without regards to meal.  If a dose is missed, administer as soon as it is remembered and restart administration cycle    Storage/Handling/Disposal:  Refridgerated.  Patient will dispose of needles in a sharps container or empty laundry detergent bottle.      Drug-Drug & Drug-Food Interactions:  None noted    Side-effects:    ?? Discussed the risk of injection site reactions, headache, skin rash, infections, abnormalities in blood counts, and malignancy.   ?? In the case of signs of infections including fever, discomfort when urinating, sinus congestion, or other complaints, patient should hold the next dose of Humira and call clinic to ensure adequate medical care  ?? In the case where a planned procedure is scheduled or live vaccines are indicated, patient should contact the clinic for further recommendations  ?? Counseled on signs of a significant drug reaction (wheezing, chest tightness, fever, itching, cough, blue skin color, seizures, swelling of face, lips, tongue or throat, etc)    Injection Training:   ?? Reviewed injection techniques and patient felt comfortable with self-administration at home.  Injection site-reactions discussed.    Emphasized the importance of adherence to prescribed regimen, clinic follow-up visits, and laboratory testing.      All questions were answered and contact information provided for any future questions/concerns.      Jeneen Montgomery

## 2018-02-13 MED FILL — PRAZOSIN HCL/1MG/CAPS: PRAZOSIN HCL/1MG/CAPS | 6 days supply | Qty: 12 | Fill #1

## 2018-02-14 MED ORDER — DIVALPROEX 125 MG TABLET,DELAYED RELEASE: tablet | 0 refills | 0 days | Status: AC

## 2018-02-14 MED ORDER — DIVALPROEX 125 MG TABLET,DELAYED RELEASE
ORAL | 99 refills | 0.00000 days | Status: CP
Start: 2018-02-14 — End: 2018-02-14

## 2018-02-20 ENCOUNTER — Ambulatory Visit: Admit: 2018-02-20 | Discharge: 2018-02-21 | Attending: Family | Primary: Family

## 2018-02-20 DIAGNOSIS — Z23 Encounter for immunization: Secondary | ICD-10-CM

## 2018-02-20 DIAGNOSIS — B182 Chronic viral hepatitis C: Principal | ICD-10-CM

## 2018-02-20 DIAGNOSIS — Z5181 Encounter for therapeutic drug level monitoring: Secondary | ICD-10-CM

## 2018-02-20 LAB — CBC W/ AUTO DIFF
BASOPHILS ABSOLUTE COUNT: 0 10*9/L (ref 0.0–0.1)
BASOPHILS RELATIVE PERCENT: 0.3 %
EOSINOPHILS RELATIVE PERCENT: 0.5 %
HEMOGLOBIN: 12 g/dL (ref 12.0–16.0)
LARGE UNSTAINED CELLS: 1 % (ref 0–4)
LYMPHOCYTES ABSOLUTE COUNT: 0.7 10*9/L — ABNORMAL LOW (ref 1.5–5.0)
LYMPHOCYTES RELATIVE PERCENT: 10.6 %
MEAN CORPUSCULAR HEMOGLOBIN CONC: 31 g/dL (ref 31.0–37.0)
MEAN CORPUSCULAR HEMOGLOBIN: 31 pg (ref 26.0–34.0)
MEAN CORPUSCULAR VOLUME: 100 fL (ref 80.0–100.0)
MEAN PLATELET VOLUME: 7.9 fL (ref 7.0–10.0)
MONOCYTES RELATIVE PERCENT: 5.3 %
NEUTROPHILS RELATIVE PERCENT: 82.8 %
PLATELET COUNT: 286 10*9/L (ref 150–440)
RED BLOOD CELL COUNT: 3.89 10*12/L — ABNORMAL LOW (ref 4.00–5.20)
RED CELL DISTRIBUTION WIDTH: 13.2 % (ref 12.0–15.0)
WBC ADJUSTED: 6.5 10*9/L (ref 4.5–11.0)

## 2018-02-20 LAB — BASIC METABOLIC PANEL
BUN / CREAT RATIO: 21
CALCIUM: 9.8 mg/dL (ref 8.5–10.2)
CHLORIDE: 108 mmol/L — ABNORMAL HIGH (ref 98–107)
CO2: 25 mmol/L (ref 22.0–30.0)
CREATININE: 1.06 mg/dL — ABNORMAL HIGH (ref 0.60–1.00)
EGFR MDRD AF AMER: >=60 mL/min/{1.73_m2} (ref >=60)
EGFR MDRD NON AF AMER: >=60 mL/min/{1.73_m2} (ref >=60)
GLUCOSE RANDOM: 83 mg/dL (ref 65–179)
POTASSIUM: 4.2 mmol/L (ref 3.5–5.0)
SODIUM: 144 mmol/L (ref 135–145)

## 2018-02-20 LAB — HEPATIC FUNCTION PANEL
ALBUMIN: 4.1 g/dL (ref 3.5–5.0)
ALKALINE PHOSPHATASE: 66 U/L (ref 38–126)
ALT (SGPT): 23 U/L (ref 15–48)
AST (SGOT): 21 U/L (ref 14–38)
BILIRUBIN DIRECT: <0.1 mg/dL (ref 0.00–0.40)

## 2018-02-20 LAB — HEPATITIS B SURFACE ANTIBODY QUANT: Hepatitis B virus surface Ab:ACnc:Pt:Ser:Qn:: 66.66 — ABNORMAL HIGH

## 2018-02-20 LAB — PREGNANCY TEST URINE: Lab: NEGATIVE

## 2018-02-20 LAB — MACROCYTES

## 2018-02-20 LAB — HEPATITIS B SURFACE ANTIBODY: HEPATITIS B SURFACE ANTIBODY QUANT: 66.66 m[IU]/mL — ABNORMAL HIGH (ref <8.00)

## 2018-02-20 LAB — BLOOD UREA NITROGEN: Urea nitrogen:MCnc:Pt:Ser/Plas:Qn:: 22 — ABNORMAL HIGH

## 2018-02-20 LAB — ALT (SGPT): Alanine aminotransferase:CCnc:Pt:Ser/Plas:Qn:: 23

## 2018-02-20 NOTE — Unmapped (Signed)
1.  Laboratory studies done today as part of continued hepatitis C care.   2.  Office follow up four weeks for end of treatment visit.   3.  Continue taking Mavyret three tablets daily with food. Avoid missing any doses.   4.  No alcohol use.   5.  Exchange toothbrushes, razors, etc.   6.  Increase water intake.   7.  Okay to take Tylenol up to 2,000 mg total daily as needed.

## 2018-02-20 NOTE — Unmapped (Signed)
[facility]    Woodfin Ganja M.D.  Associate professor of Medicine  [facility]  Friend [facility] [place] [facility] [place]  [phone]    PRIMARY CARE PROVIDER: Glendale Chard, MD      SUBJECTIVE:     CHIEF COMPLAINT: Treatment follow up HCV, genotype 3  Mavyret x 8 weeks  Start Date: 01/14/2018  TW#6    HISTORY OF PRESENT ILLNESS:   Erica Jenkins is a 32 y.o. woman with history significant for bipolar disorder, chronic hepatitis C, migraine disorder, pelvic inflammatory disease, rheumatoid arthritis, and polysubstance abuse in remission who presents for chronic hepatitis C.   She was tested 2-3 years ago and negative for hepatitis C, then tested positive about one year prior. She was using IV drugs during that interval but has been clean about one year.    She presents today with for treatment follow up ~ TW #6.  She has adhered to taking Mavyret three tablets once daily. No alcohol use. She was started on Cymbalta (replacing fluoxetine), but when our pharmacy staff reached out to her on January 25, 2018 she reported she had stopped Cymbalta due to dizziness and remains off fluoxentine as well. She is taking Voltaren orally as well as using gel for pain management. Additionally taking Mobic daily. Omeprazole 20 mg daily prophylactic management. She does admit to poor water intake. Normally her sleep pattern is altered. Denies being worse. Additionally, she has been experiencing headaches which are normal in the setting of having migraine headaches. Denies being worse. Taking Linzess prn. Denies jaundice, scleral icterus, lower extremity edema, ascites, confusion, hematemesis, melena, or hematochezia.    Pending PA approval for Humira. She remains under the care of rheumatology for management of RA. She feels her anxiety is not well controlled and pending follow up with psych or PCP.  She does not have any refills for Klonopin.     Liver Section:  1. Chronic hepatitis C, treatment naive, genotype 3  Baseline HCV RNA: 1,166 IU/mL  HCV RNA TW #6 - pending    2. Immunity hepatitis B: + immunity  3. Immunity hepatitis A: #1 vaccine administered 02/20/2018  4. Fibroscan 12/02/2017- 6.4 kPa/F1-F2  5. HIV- non reactive    REVIEW OF SYSTEMS:   The balance of 12 systems reviewed is negative except as noted in the history of present illness.    Past Medical History:   Diagnosis Date   ??? Bipolar disease, chronic (CMS-HCC)    ??? Chronic hepatitis C (CMS-HCC)    ??? Migraines    ??? Pelvic inflammation in female    ??? Rheumatoid arthritis (CMS-HCC)    ??? 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 Tobacco abuse      Past Surgical History:   Procedure Laterality Date   ??? CESAREAN SECTION     ??? CHOLECYSTECTOMY  2017    acute cholecystitis   ??? HERNIA REPAIR     ??? OVARIAN CYST SURGERY     ??? TONSILLECTOMY AND ADENOIDECTOMY     ??? TUBAL LIGATION       Current Outpatient Medications   Medication Sig Dispense Refill   ??? acetaminophen (ARTHRITIS PAIN RELIEF, ACETAM,) 650 MG CR tablet Take 650 mg by mouth every eight (8) hours as needed for pain.     ??? amLODIPine (NORVASC) 5 MG tablet TAKE 1 TABLET BY MOUTH ONCE DAILY 90 tablet 3   ??? aspirin (ECOTRIN) 81 MG tablet Take 81 mg by mouth daily.     ???  cyclobenzaprine (FLEXERIL) 5 MG tablet Take 1 tablet (5 mg total) by mouth nightly. 10 tablet 0   ??? diclofenac (VOLTAREN) 75 MG EC tablet Take 1 tablet (75 mg total) by mouth Two (2) times a day. 60 tablet 11   ??? divalproex (DEPAKOTE SPRINKLE) 125 mg capsule Take 1 capsule (125 mg total) by mouth three (3) times a day (at 6am, noon and 6pm). 180 capsule 3   ??? divalproex (DEPAKOTE) 125 MG DR tablet TAKE 1 TABLET BY MOUTH THREE TIMES DAILY 180 tablet PRN   ??? glecaprevir-pibrentasvir (MAVYRET) 100-40 mg tablet Take 3 tablets by mouth daily. Take with food. 1 4-week carton 1   ??? hydroxychloroquine (PLAQUENIL) 200 mg tablet Take 1 tablet (200 mg total) by mouth Two (2) times a day. 60 tablet 4   ??? hydrOXYzine (VISTARIL) 25 MG capsule Take 1 or 2 capsules every 8 hours as needed for anxiety 180 capsule 2   ??? ibuprofen (ADVIL,MOTRIN) 200 MG tablet Take 200 mg by mouth every six (6) hours as needed for pain.     ??? linaclotide (LINZESS) 145 mcg capsule Take 1 capsule (145 mcg total) by mouth daily. 30 capsule 0   ??? meloxicam (MOBIC) 15 MG tablet Take 1 tablet (15 mg total) by mouth daily. 90 tablet 3   ??? omeprazole (PRILOSEC) 20 MG capsule TAKE 2 CAPSULES BY MOUTH ONCE DAILY 180 capsule 3   ??? prazosin (MINIPRESS) 1 MG capsule Take 2 capsules (2 mg total) by mouth nightly. 90 capsule 3   ??? predniSONE (DELTASONE) 10 MG tablet Take 1 tablet (10 mg total) by mouth daily. 30 tablet 3   ??? promethazine (PHENERGAN) 25 MG tablet Take 25 mg by mouth every six (6) hours as needed for nausea.     ??? topiramate (TOPAMAX) 25 MG tablet Start 1 PO QHS for 5 nights, then 1 PO BID for 5 days, then 1 PO QAM and 2 PO QHS for 5 days, then 2 PO BID thereafter. 120 tablet 3   ??? ADALIMUMAB PEN CITRATE FREE 40 MG/0.4 ML Inject 0.4 mL (40 mg total) under the skin every fourteen (14) days. (Patient not taking: Reported on 02/20/2018) 6 each 3     No current facility-administered medications for this visit.      Allergies  Reviewed at 8:04 AM      Reactions Comment    Cymbalta [duloxetine]      Etanercept  Other reaction(s): HIVES        Family History   Problem Relation Age of Onset   ??? Hypertension Mother    ??? Bipolar disorder Father    ??? Alzheimer's disease Maternal Grandfather    ??? Prostate cancer Maternal Grandfather    ??? Diabetes Maternal Grandfather    ??? Heart attack Maternal Grandfather    ??? Breast cancer Maternal Grandmother      Social History     Tobacco Use   ??? Smoking status: Former Smoker     Packs/day: 0.50   ??? Smokeless tobacco: Never Used   Substance Use Topics   ??? Alcohol use: No   ??? Drug use: No     OBJECTIVE:   VITAL SIGNS: BP 119/77 (BP Site: L Arm, BP Position: Sitting, BP Cuff Size: X-Large)  - Pulse 80  - Temp 36.8 ??C (98.2 ??F) (Oral)  - Wt 70.3 kg (155 lb)  - LMP 02/17/2018  - SpO2 100%  - Breastfeeding? No  - BMI 28.34 kg/m??  PHYSICAL EXAM:  General appearance: Appears well, no distress.  Eyes: Anicteric sclera. No erythema.  Neurologic: Alert, oriented, and appropriate.  Psychiatric: Flat affect, depressive mood    GI PROCEDURES:  None.    RADIOGRAPHIC STUDIES:  None germane.     LABORATORY RESULTS:  Results for orders placed or performed in visit on 02/20/18   Hepatic Function Panel   Result Value Ref Range    Albumin 4.1 3.5 - 5.0 g/dL    Total Protein 7.5 6.5 - 8.3 g/dL    Total Bilirubin 0.3 0.0 - 1.2 mg/dL    Bilirubin, Direct <1.61 0.00 - 0.40 mg/dL    AST 21 14 - 38 U/L    ALT 23 15 - 48 U/L    Alkaline Phosphatase 66 38 - 126 U/L   Hepatitis B Surface Antibody   Result Value Ref Range    Hep B S Ab Reactive (A) Nonreactive, Grayzone    Hepatitis B Surface Ab Quant 66.66 (H) <8.00 m(IU)/mL   Basic metabolic panel   Result Value Ref Range    Sodium 144 135 - 145 mmol/L    Potassium 4.2 3.5 - 5.0 mmol/L    Chloride 108 (H) 98 - 107 mmol/L    CO2 25.0 22.0 - 30.0 mmol/L    BUN 22 (H) 7 - 21 mg/dL    Creatinine 0.96 (H) 0.60 - 1.00 mg/dL    BUN/Creatinine Ratio 21     EGFR MDRD Non Af Amer >=60 >=60 mL/min/1.61m2    EGFR MDRD Af Amer >=60 >=60 mL/min/1.42m2    Anion Gap 11 9 - 15 mmol/L    Glucose 83 65 - 179 mg/dL    Calcium 9.8 8.5 - 04.5 mg/dL   Pregnancy Qualitative, Urine   Result Value Ref Range    Pregnancy Test, Urine Negative Negative   CBC w/ Differential   Result Value Ref Range    WBC 6.5 4.5 - 11.0 10*9/L    RBC 3.89 (L) 4.00 - 5.20 10*12/L    HGB 12.0 12.0 - 16.0 g/dL    HCT 40.9 81.1 - 91.4 %    MCV 100.0 80.0 - 100.0 fL    MCH 31.0 26.0 - 34.0 pg    MCHC 31.0 31.0 - 37.0 g/dL    RDW 78.2 95.6 - 21.3 %    MPV 7.9 7.0 - 10.0 fL    Platelet 286 150 - 440 10*9/L    Neutrophils % 82.8 %    Lymphocytes % 10.6 %    Monocytes % 5.3 %    Eosinophils % 0.5 %    Basophils % 0.3 %    Neutrophil Left Shift 1+ (A) Not Present    Absolute Neutrophils 5.4 2.0 - 7.5 10*9/L    Absolute Lymphocytes 0.7 (L) 1.5 - 5.0 10*9/L    Absolute Monocytes 0.3 0.2 - 0.8 10*9/L    Absolute Eosinophils 0.0 0.0 - 0.4 10*9/L    Absolute Basophils 0.0 0.0 - 0.1 10*9/L    Large Unstained Cells 1 0 - 4 %    Macrocytosis Slight (A) Not Present       ASSESSMENT AND PLAN:   1. Chronic hepatitis C, treatment naive, genotype 3:  Lise Pincus is a 32 y.o. woman with history significant for bipolar disorder, chronic hepatitis C, migraine disorder, pelvic inflammatory disease, rheumatoid arthritis, and polysubstance abuse in remission who presents today for treatment follow up ~ TW #6. She has adhered to taking three tables of Mavyret daily  with breakfast meal. Denies having missed any doses or any alcohol use. Overall, she has been tolerating medication well. At baseline she experienced headaches, dizziness, and altered sleep pattern. Denies symptoms having been exacerbated with DAA treatment.   Fibrosis score consistent with minimal evidence of fibrosis.     ~ HCV safety labs ordered.   ~ Office follow up four weeks  ~ Continue Mavyret three tablets with daily with meal.   ~ No alcohol use  ~ Exchange toothbrushes, razors, etc.   ~ Okay Tylenol up to 2,000 mg total daily prn.   ~ Increase water intake.     2. RA: Under the care of rheumatology. Reviewed patient's history with Dr. Woodfin Ganja prior to patient presenting today. 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 She had minimal fibrosis on Fibroscan (<F2). From a liver standpoint, it is fine to start an anti-TNF if needed for her RA, even prior to completion of HCV therapy. If methotrexate is needed, this can also be started with minimal to no fibrosis or NAFLD risk factors. With MTX, we would wait until 3 months post HCV treatment to ensure HCV cure and to avoid a possible bump in liver enzymes with MTX that could confuse the picture. Likewise, no issue with prednisone. As noted in her medication list, sulfasalazine is being held due to DDI with HCV treatment medications. She has documentation of receiving full HBV vaccination series [phone] but no recent titer.    3.  Immunity status hepatitis A:  She does not have documented immunity to HAV. #1 hepatitis A vaccine administered today.     4. Family counseling: Current barrier method ~ surgical. Her boyfriend and all four children have been screened for HCV and non reactive results.     All patient's questions were answered to her and her boyfriend's satisfaction during office visit today.     Rodman Key, DNP, FNP-BC  [facility]  [address]  [place] [address]  Phone [phone]

## 2018-02-24 LAB — HCV RNA: Hepatitis C virus RNA:PrThr:Pt:Ser/Plas:Ord:Probe.amp.tar: NOT DETECTED

## 2018-02-24 LAB — HEPATITIS C RNA, QUANTITATIVE, PCR: HCV RNA: NOT DETECTED

## 2018-02-25 NOTE — Unmapped (Signed)
Spoke with patient.  Called to discuss laboratory results in which she had sent me three in basket messages. Patient advised it was not a good time to review her lab results and request I call her back this evening after normal business hours. Advised patient I would be glad to call her tomorrow. She request I call her at noon specifically.  Responded I will try to call her at the requested time.

## 2018-02-26 NOTE — Unmapped (Signed)
Spoke with patient and reviewed recent laboratory results. Patient voiced understanding and will remain scheduled for upcoming appointment in four weeks ~ EOT visit.

## 2018-02-27 NOTE — Unmapped (Signed)
Patient has been approved to receive Humira from the manufacturer from  02/27/18 until 02/25/19 and will be shipped directly to the patient's home.

## 2018-02-27 NOTE — Unmapped (Signed)
Informed patient that Humira is approved through [name] and provided her with contact info to request for shipment.  Counseling was provided on 02/11/18 and patient does not have any additional question/concern at this time.  She will give me a call after the first injection to let us know that she has started on therapy.    Erica Jenkins

## 2018-03-10 ENCOUNTER — Ambulatory Visit: Admit: 2018-03-10 | Discharge: 2018-03-11 | Attending: Family | Primary: Family

## 2018-03-10 DIAGNOSIS — B182 Chronic viral hepatitis C: Principal | ICD-10-CM

## 2018-03-10 DIAGNOSIS — Z5181 Encounter for therapeutic drug level monitoring: Secondary | ICD-10-CM

## 2018-03-11 MED FILL — PRAZOSIN HCL/1MG/CAPS: PRAZOSIN HCL/1MG/CAPS | 45 days supply | Qty: 90 | Fill #3

## 2018-03-11 MED FILL — DICLOFENAC/75MG/TAB: DICLOFENAC/75MG/TAB | 30 days supply | Qty: 60 | Fill #1

## 2018-03-11 MED FILL — DIVALPROEX SPRINKLE/125MG/CPSP: DIVALPROEX SPRINKLE/125MG/CPSP | 60 days supply | Qty: 180 | Fill #1

## 2018-03-11 MED FILL — TOPIRAMATE/25MG/TABS: TOPIRAMATE/25MG/TABS | 30 days supply | Qty: 120 | Fill #2

## 2018-03-28 ENCOUNTER — Ambulatory Visit: Admit: 2018-03-28 | Discharge: 2018-03-28

## 2018-03-28 ENCOUNTER — Ambulatory Visit
Admit: 2018-03-28 | Discharge: 2018-03-28 | Attending: Student in an Organized Health Care Education/Training Program | Primary: Student in an Organized Health Care Education/Training Program

## 2018-03-28 DIAGNOSIS — F314 Bipolar disorder, current episode depressed, severe, without psychotic features: Secondary | ICD-10-CM

## 2018-03-28 DIAGNOSIS — M05761 Rheumatoid arthritis with rheumatoid factor of right knee without organ or systems involvement: Secondary | ICD-10-CM

## 2018-03-28 DIAGNOSIS — A6000 Herpesviral infection of urogenital system, unspecified: Secondary | ICD-10-CM

## 2018-03-28 DIAGNOSIS — K5900 Constipation, unspecified: Principal | ICD-10-CM

## 2018-03-28 DIAGNOSIS — F4310 Post-traumatic stress disorder, unspecified: Secondary | ICD-10-CM

## 2018-03-28 DIAGNOSIS — Z91410 Personal history of adult physical and sexual abuse: Secondary | ICD-10-CM

## 2018-03-28 DIAGNOSIS — K219 Gastro-esophageal reflux disease without esophagitis: Secondary | ICD-10-CM

## 2018-03-28 DIAGNOSIS — B182 Chronic viral hepatitis C: Secondary | ICD-10-CM

## 2018-03-28 DIAGNOSIS — K5909 Other constipation: Secondary | ICD-10-CM

## 2018-03-28 DIAGNOSIS — M05762 Rheumatoid arthritis with rheumatoid factor of left knee without organ or systems involvement: Secondary | ICD-10-CM

## 2018-03-28 DIAGNOSIS — B009 Herpesviral infection, unspecified: Secondary | ICD-10-CM

## 2018-03-28 HISTORY — DX: Other constipation: K59.09

## 2018-03-28 HISTORY — DX: Personal history of adult physical and sexual abuse: Z91.410

## 2018-03-28 MED ORDER — PAROXETINE 20 MG TABLET: 20 mg | tablet | Freq: Every day | 2 refills | 0 days | Status: AC

## 2018-03-28 MED ORDER — PAROXETINE 20 MG TABLET
ORAL_TABLET | Freq: Every day | ORAL | 2 refills | 0.00000 days | Status: CP
Start: 2018-03-28 — End: 2018-07-03

## 2018-03-28 MED ORDER — LINACLOTIDE 72 MCG CAPSULE
ORAL_CAPSULE | Freq: Every day | ORAL | 3 refills | 0.00000 days | Status: CP
Start: 2018-03-28 — End: 2018-03-28

## 2018-03-28 MED ORDER — VALACYCLOVIR 1 GRAM TABLET
ORAL_TABLET | Freq: Every day | ORAL | 1 refills | 0.00000 days | Status: CP
Start: 2018-03-28 — End: 2018-06-05

## 2018-03-28 MED ORDER — SENNOSIDES 8.6 MG-DOCUSATE SODIUM 50 MG TABLET: 2 | each | 1 refills | 0 days | Status: AC

## 2018-03-28 MED ORDER — OMEPRAZOLE 20 MG CAPSULE,DELAYED RELEASE: 40 mg | capsule | Freq: Every day | 3 refills | 0 days | Status: AC

## 2018-03-28 MED ORDER — POLYETHYLENE GLYCOL 3350 17 GRAM ORAL POWDER PACKET
Freq: Two times a day (BID) | ORAL | 3 refills | 0.00000 days | Status: CP
Start: 2018-03-28 — End: 2018-03-28

## 2018-03-28 MED ORDER — VALACYCLOVIR 1 GRAM TABLET: 1000 mg | tablet | Freq: Every day | 1 refills | 0 days | Status: AC

## 2018-03-28 MED ORDER — SENNOSIDES 8.6 MG-DOCUSATE SODIUM 50 MG TABLET
Freq: Every day | ORAL | 1 refills | 0.00000 days | Status: CP | PRN
Start: 2018-03-28 — End: 2018-12-01

## 2018-03-28 MED ORDER — OMEPRAZOLE 20 MG CAPSULE,DELAYED RELEASE
ORAL_CAPSULE | Freq: Every day | ORAL | 3 refills | 0.00000 days | Status: CP
Start: 2018-03-28 — End: 2018-05-30

## 2018-03-28 MED ORDER — LINACLOTIDE 72 MCG CAPSULE: 72 ug | capsule | Freq: Every day | 3 refills | 0 days | Status: AC

## 2018-03-28 MED ORDER — POLYETHYLENE GLYCOL 3350 17 GRAM ORAL POWDER PACKET: 17 g | each | Freq: Two times a day (BID) | 3 refills | 0 days | Status: AC

## 2018-04-01 MED ORDER — PREDNISONE 10 MG TABLET: 10 mg | tablet | Freq: Every day | 3 refills | 0 days | Status: AC

## 2018-04-01 MED ORDER — HYDROXYCHLOROQUINE 200 MG TABLET: 200 mg | tablet | 4 refills | 0 days

## 2018-04-01 MED ORDER — PREDNISONE 10 MG TABLET
ORAL_TABLET | Freq: Every day | ORAL | 3 refills | 0.00000 days | Status: CP
Start: 2018-04-01 — End: 2018-12-23

## 2018-04-01 MED ORDER — TOPIRAMATE 25 MG TABLET
ORAL_TABLET | 3 refills | 0.00000 days | Status: CP
Start: 2018-04-01 — End: 2018-04-01

## 2018-04-01 MED ORDER — TOPIRAMATE 25 MG TABLET: tablet | 3 refills | 0 days

## 2018-04-01 MED ORDER — HYDROXYCHLOROQUINE 200 MG TABLET
ORAL_TABLET | Freq: Two times a day (BID) | ORAL | 4 refills | 0.00000 days | Status: CP
Start: 2018-04-01 — End: 2018-04-01

## 2018-04-07 MED FILL — PREDNISONE/10MG/TABS: PREDNISONE/10MG/TABS | 30 days supply | Qty: 30 | Fill #3

## 2018-04-07 MED FILL — HEALTHYLAX//PACK: HEALTHYLAX//PACK | 36 days supply | Qty: 72 | Fill #0

## 2018-04-07 MED FILL — PRAZOSIN HCL/1MG/CAPS: PRAZOSIN HCL/1MG/CAPS | 90 days supply | Qty: 90 | Fill #0

## 2018-04-07 MED FILL — HYDROXYCHLOROQUINE SULFAT/200MG/TABS: HYDROXYCHLOROQUINE SULFAT/200MG/TABS | 30 days supply | Qty: 60 | Fill #0

## 2018-04-07 MED FILL — SENNA PLUS/8.6-50MG/TABS: SENNA PLUS/8.6-50MG/TABS | 15 days supply | Qty: 30 | Fill #0

## 2018-04-07 MED FILL — TOPIRAMATE/25MG/TABS: TOPIRAMATE/25MG/TABS | 30 days supply | Qty: 120 | Fill #3

## 2018-04-07 MED FILL — OMEPRAZOLE/20MG/CPDR: OMEPRAZOLE/20MG/CPDR | 90 days supply | Qty: 180 | Fill #1

## 2018-04-07 MED FILL — AMLODIPINE/5MG/TABS: AMLODIPINE/5MG/TABS | 90 days supply | Qty: 90 | Fill #1

## 2018-04-07 MED FILL — DICLOFENAC/75MG/TAB: DICLOFENAC/75MG/TAB | 30 days supply | Qty: 60 | Fill #2

## 2018-04-07 MED FILL — PAROXETINE HCL/20MG/TABS: PAROXETINE HCL/20MG/TABS | 30 days supply | Qty: 30 | Fill #0

## 2018-04-15 ENCOUNTER — Ambulatory Visit
Admit: 2018-04-15 | Discharge: 2018-04-16 | Attending: Student in an Organized Health Care Education/Training Program | Primary: Student in an Organized Health Care Education/Training Program

## 2018-04-15 DIAGNOSIS — F3132 Bipolar disorder, current episode depressed, moderate: Principal | ICD-10-CM

## 2018-05-07 MED FILL — SENNA PLUS/8.6-50MG/TABS: SENNA PLUS/8.6-50MG/TABS | 15 days supply | Qty: 30 | Fill #1

## 2018-05-07 MED FILL — PREDNISONE/10MG/TABS: PREDNISONE/10MG/TABS | 30 days supply | Qty: 30 | Fill #0

## 2018-05-07 MED FILL — TOPIRAMATE/25MG/TABS: TOPIRAMATE/25MG/TABS | 37 days supply | Qty: 120 | Fill #0

## 2018-05-26 MED ORDER — PREDNISONE 10 MG TABLET
ORAL | 3 refills | 0.00000 days | Status: CP
Start: 2018-05-26 — End: 2018-05-30
  Filled 2018-07-21: qty 30, 30d supply, fill #0

## 2018-05-26 MED ORDER — PREDNISONE 10 MG TABLET: each | 3 refills | 0 days | Status: AC

## 2018-05-29 MED ORDER — TOPIRAMATE 25 MG TABLET: tablet | 2 refills | 0 days

## 2018-05-29 MED ORDER — SENNOSIDES 8.6 MG-DOCUSATE SODIUM 50 MG TABLET
ORAL | 99 refills | 0 days | Status: CP
Start: 2018-05-29 — End: 2019-03-31
  Filled 2018-07-21: qty 30, 15d supply, fill #0

## 2018-05-29 MED ORDER — TOPIRAMATE 25 MG TABLET
ORAL_TABLET | 2 refills | 0.00000 days | Status: CP
Start: 2018-05-29 — End: 2018-05-29
  Filled 2018-07-21: qty 210, 19d supply, fill #0

## 2018-05-29 MED ORDER — SENNA PLUS 8.6 MG-50 MG TABLET
PRN refills | 0 days | Status: CP
Start: 2018-05-29 — End: 2018-05-29

## 2018-05-29 MED FILL — HYDROXYCHLOROQUINE SULFAT/200MG/TABS: HYDROXYCHLOROQUINE SULFAT/200MG/TABS | 30 days supply | Qty: 60 | Fill #1

## 2018-05-29 MED FILL — DICLOFENAC/75MG/TAB: DICLOFENAC/75MG/TAB | 30 days supply | Qty: 60 | Fill #3

## 2018-05-29 MED FILL — DIVALPROEX SPRINKLE/125MG/CPSP: DIVALPROEX SPRINKLE/125MG/CPSP | 60 days supply | Qty: 180 | Fill #2

## 2018-05-29 MED FILL — PREDNISONE/10MG/TABS: PREDNISONE/10MG/TABS | 30 days supply | Qty: 30 | Fill #0

## 2018-05-30 ENCOUNTER — Ambulatory Visit: Admit: 2018-05-30 | Discharge: 2018-05-31

## 2018-05-30 DIAGNOSIS — Z7189 Other specified counseling: Secondary | ICD-10-CM

## 2018-05-30 DIAGNOSIS — M0579 Rheumatoid arthritis with rheumatoid factor of multiple sites without organ or systems involvement: Principal | ICD-10-CM

## 2018-05-30 DIAGNOSIS — Z79899 Other long term (current) drug therapy: Secondary | ICD-10-CM

## 2018-05-30 DIAGNOSIS — B182 Chronic viral hepatitis C: Secondary | ICD-10-CM

## 2018-05-30 MED ORDER — HYDROXYCHLOROQUINE 200 MG TABLET: 200 mg | tablet | Freq: Two times a day (BID) | 2 refills | 0 days | Status: AC

## 2018-05-30 MED ORDER — HYDROXYCHLOROQUINE 200 MG TABLET
ORAL_TABLET | Freq: Two times a day (BID) | ORAL | 2 refills | 0.00000 days | Status: CP
Start: 2018-05-30 — End: 2018-05-30
  Filled 2018-07-21: qty 180, 90d supply, fill #0

## 2018-06-02 ENCOUNTER — Ambulatory Visit: Admit: 2018-06-02 | Discharge: 2018-06-02 | Attending: Family | Primary: Family

## 2018-06-02 ENCOUNTER — Ambulatory Visit: Admit: 2018-06-02 | Discharge: 2018-06-02 | Attending: Gastroenterology | Primary: Gastroenterology

## 2018-06-02 DIAGNOSIS — B182 Chronic viral hepatitis C: Principal | ICD-10-CM

## 2018-06-02 DIAGNOSIS — F4310 Post-traumatic stress disorder, unspecified: Secondary | ICD-10-CM

## 2018-06-02 DIAGNOSIS — F3175 Bipolar disorder, in partial remission, most recent episode depressed: Secondary | ICD-10-CM

## 2018-06-05 ENCOUNTER — Ambulatory Visit
Admit: 2018-06-05 | Discharge: 2018-06-06 | Attending: Student in an Organized Health Care Education/Training Program | Primary: Student in an Organized Health Care Education/Training Program

## 2018-06-05 DIAGNOSIS — N898 Other specified noninflammatory disorders of vagina: Principal | ICD-10-CM

## 2018-06-05 MED ORDER — DOXYCYCLINE HYCLATE 100 MG CAPSULE
ORAL_CAPSULE | Freq: Two times a day (BID) | ORAL | 0 refills | 0 days | Status: CP
Start: 2018-06-05 — End: 2018-06-19
  Filled 2018-06-05: qty 28, 14d supply, fill #0

## 2018-06-05 MED ORDER — VALACYCLOVIR 1 GRAM TABLET
ORAL_TABLET | Freq: Every day | ORAL | 0 refills | 0 days | Status: CP
Start: 2018-06-05 — End: 2018-06-10
  Filled 2018-06-05: qty 5, 5d supply, fill #0

## 2018-06-05 MED FILL — DOXYCYCLINE HYCLATE 100 MG CAPSULE: 14 days supply | Qty: 28 | Fill #0 | Status: AC

## 2018-06-05 MED FILL — VALACYCLOVIR 1 GRAM TABLET: 5 days supply | Qty: 5 | Fill #0 | Status: AC

## 2018-07-03 ENCOUNTER — Ambulatory Visit: Admit: 2018-07-03 | Discharge: 2018-07-04 | Attending: Psychiatric/Mental Health | Primary: Psychiatric/Mental Health

## 2018-07-03 DIAGNOSIS — F411 Generalized anxiety disorder: Secondary | ICD-10-CM

## 2018-07-03 DIAGNOSIS — F3175 Bipolar disorder, in partial remission, most recent episode depressed: Principal | ICD-10-CM

## 2018-07-03 DIAGNOSIS — F329 Major depressive disorder, single episode, unspecified: Secondary | ICD-10-CM

## 2018-07-03 DIAGNOSIS — F4310 Post-traumatic stress disorder, unspecified: Secondary | ICD-10-CM

## 2018-07-03 MED ORDER — DIVALPROEX 125 MG CAPSULE,DELAYED RELEASE SPRINKLE
ORAL_CAPSULE | 0 refills | 0 days | Status: CP
Start: 2018-07-03 — End: 2018-07-17

## 2018-07-03 MED ORDER — PRAZOSIN 1 MG CAPSULE
ORAL_CAPSULE | 0 refills | 0 days | Status: CP
Start: 2018-07-03 — End: 2018-08-15

## 2018-07-03 MED ORDER — HYDROXYZINE HCL 25 MG TABLET
ORAL_TABLET | 0 refills | 0 days | Status: CP
Start: 2018-07-03 — End: 2018-08-01
  Filled 2018-07-21: qty 180, 90d supply, fill #0

## 2018-07-17 ENCOUNTER — Ambulatory Visit: Admit: 2018-07-17 | Discharge: 2018-07-18 | Attending: Psychiatric/Mental Health | Primary: Psychiatric/Mental Health

## 2018-07-17 DIAGNOSIS — F4310 Post-traumatic stress disorder, unspecified: Principal | ICD-10-CM

## 2018-07-17 MED ORDER — OMEPRAZOLE 20 MG CAPSULE,DELAYED RELEASE
ORAL_CAPSULE | ORAL | 0 refills | 0 days | Status: CP
Start: 2018-07-17 — End: 2018-09-22
  Filled 2018-07-23: qty 120, 60d supply, fill #0

## 2018-07-17 MED ORDER — DIVALPROEX 125 MG CAPSULE,DELAYED RELEASE SPRINKLE
ORAL_CAPSULE | 0 refills | 0 days | Status: CP
Start: 2018-07-17 — End: 2018-08-15
  Filled 2018-07-21: qty 120, 30d supply, fill #0

## 2018-07-21 MED FILL — HYDROXYZINE HCL 25 MG TABLET: 90 days supply | Qty: 180 | Fill #0 | Status: AC

## 2018-07-21 MED FILL — DICLOFENAC SODIUM 75 MG TABLET,DELAYED RELEASE: 30 days supply | Qty: 60 | Fill #0 | Status: AC

## 2018-07-21 MED FILL — AMLODIPINE 5 MG TABLET: 90 days supply | Qty: 90 | Fill #0 | Status: AC

## 2018-07-21 MED FILL — AMLODIPINE 5 MG TABLET: ORAL | 90 days supply | Qty: 90 | Fill #0

## 2018-07-21 MED FILL — SENNA PLUS 8.6 MG-50 MG TABLET: 15 days supply | Qty: 30 | Fill #0 | Status: AC

## 2018-07-21 MED FILL — DIVALPROEX 125 MG CAPSULE,DELAYED RELEASE SPRINKLE: 30 days supply | Qty: 120 | Fill #0 | Status: AC

## 2018-07-21 MED FILL — TOPIRAMATE 25 MG TABLET: 19 days supply | Qty: 210 | Fill #0 | Status: AC

## 2018-07-21 MED FILL — HYDROXYCHLOROQUINE 200 MG TABLET: 90 days supply | Qty: 180 | Fill #0 | Status: AC

## 2018-07-21 MED FILL — DICLOFENAC SODIUM 75 MG TABLET,DELAYED RELEASE: ORAL | 30 days supply | Qty: 60 | Fill #0

## 2018-07-21 MED FILL — PREDNISONE 10 MG TABLET: 30 days supply | Qty: 30 | Fill #0 | Status: AC

## 2018-07-23 MED FILL — OMEPRAZOLE 20 MG CAPSULE,DELAYED RELEASE: 60 days supply | Qty: 120 | Fill #0 | Status: AC

## 2018-07-31 ENCOUNTER — Ambulatory Visit: Admit: 2018-07-31 | Discharge: 2018-07-31 | Attending: Clinical | Primary: Clinical

## 2018-07-31 ENCOUNTER — Ambulatory Visit: Admit: 2018-07-31 | Discharge: 2018-07-31 | Attending: Psychiatric/Mental Health | Primary: Psychiatric/Mental Health

## 2018-07-31 DIAGNOSIS — F4310 Post-traumatic stress disorder, unspecified: Principal | ICD-10-CM

## 2018-07-31 MED ORDER — OLANZAPINE 5 MG TABLET
ORAL_TABLET | 0 refills | 0 days | Status: CP
Start: 2018-07-31 — End: 2018-08-15
  Filled 2018-08-01: qty 30, 30d supply, fill #0

## 2018-08-01 ENCOUNTER — Ambulatory Visit
Admit: 2018-08-01 | Discharge: 2018-08-02 | Attending: Student in an Organized Health Care Education/Training Program | Primary: Student in an Organized Health Care Education/Training Program

## 2018-08-01 DIAGNOSIS — F3132 Bipolar disorder, current episode depressed, moderate: Principal | ICD-10-CM

## 2018-08-01 MED FILL — OLANZAPINE 5 MG TABLET: 30 days supply | Qty: 30 | Fill #0 | Status: AC

## 2018-08-07 MED FILL — TOPIRAMATE 25 MG TABLET: 30 days supply | Qty: 210 | Fill #1 | Status: AC

## 2018-08-07 MED FILL — TOPIRAMATE 25 MG TABLET: 30 days supply | Qty: 210 | Fill #1

## 2018-08-11 MED FILL — DICLOFENAC SODIUM 75 MG TABLET,DELAYED RELEASE: ORAL | 90 days supply | Qty: 180 | Fill #1

## 2018-08-11 MED FILL — DICLOFENAC SODIUM 75 MG TABLET,DELAYED RELEASE: 90 days supply | Qty: 180 | Fill #1 | Status: AC

## 2018-08-15 ENCOUNTER — Ambulatory Visit: Admit: 2018-08-15 | Discharge: 2018-08-15 | Attending: Psychiatric/Mental Health | Primary: Psychiatric/Mental Health

## 2018-08-15 ENCOUNTER — Ambulatory Visit: Admit: 2018-08-15 | Discharge: 2018-08-15 | Attending: Clinical | Primary: Clinical

## 2018-08-15 DIAGNOSIS — F4310 Post-traumatic stress disorder, unspecified: Principal | ICD-10-CM

## 2018-08-15 MED ORDER — PRAZOSIN 5 MG CAPSULE
ORAL_CAPSULE | Freq: Every evening | ORAL | 1 refills | 0 days | Status: CP
Start: 2018-08-15 — End: 2018-09-12

## 2018-08-15 MED ORDER — OLANZAPINE 5 MG TABLET
ORAL_TABLET | 1 refills | 0 days | Status: CP
Start: 2018-08-15 — End: 2018-09-12
  Filled 2018-08-29: qty 60, 30d supply, fill #0

## 2018-08-21 MED ORDER — VALACYCLOVIR 500 MG TABLET
ORAL_TABLET | Freq: Every day | ORAL | 1 refills | 3.00000 days | Status: CP
Start: 2018-08-21 — End: 2019-05-30

## 2018-08-28 MED ORDER — TOPIRAMATE 25 MG TABLET
ORAL_TABLET | 2 refills | 0 days | Status: CP
Start: 2018-08-28 — End: 2019-02-03
  Filled 2018-09-01: qty 150, 22d supply, fill #0

## 2018-08-29 ENCOUNTER — Ambulatory Visit: Admit: 2018-08-29 | Discharge: 2018-08-30 | Attending: Clinical | Primary: Clinical

## 2018-08-29 DIAGNOSIS — F4310 Post-traumatic stress disorder, unspecified: Principal | ICD-10-CM

## 2018-08-29 MED FILL — OLANZAPINE 5 MG TABLET: 30 days supply | Qty: 60 | Fill #0 | Status: AC

## 2018-09-01 MED FILL — TOPIRAMATE 25 MG TABLET: 22 days supply | Qty: 150 | Fill #0 | Status: AC

## 2018-09-12 ENCOUNTER — Ambulatory Visit: Admit: 2018-09-12 | Discharge: 2018-09-12 | Attending: Psychiatric/Mental Health | Primary: Psychiatric/Mental Health

## 2018-09-12 ENCOUNTER — Ambulatory Visit: Admit: 2018-09-12 | Discharge: 2018-09-12 | Attending: Clinical | Primary: Clinical

## 2018-09-12 DIAGNOSIS — F4310 Post-traumatic stress disorder, unspecified: Principal | ICD-10-CM

## 2018-09-12 MED ORDER — OLANZAPINE 5 MG TABLET
ORAL_TABLET | 1 refills | 0 days | Status: CP
Start: 2018-09-12 — End: 2018-10-10
  Filled 2018-09-24: qty 60, 30d supply, fill #0

## 2018-09-12 MED ORDER — PRAZOSIN 5 MG CAPSULE
ORAL_CAPSULE | Freq: Every evening | ORAL | 1 refills | 0 days | Status: CP
Start: 2018-09-12 — End: 2018-10-10
  Filled 2018-09-24: qty 30, 30d supply, fill #0

## 2018-09-12 MED ORDER — METFORMIN 500 MG TABLET
ORAL_TABLET | Freq: Two times a day (BID) | ORAL | 2 refills | 0 days | Status: CP
Start: 2018-09-12 — End: 2019-06-12
  Filled 2018-09-24: qty 180, 90d supply, fill #0

## 2018-09-24 MED FILL — OLANZAPINE 5 MG TABLET: 30 days supply | Qty: 60 | Fill #0 | Status: AC

## 2018-09-24 MED FILL — PRAZOSIN 5 MG CAPSULE: 30 days supply | Qty: 30 | Fill #0 | Status: AC

## 2018-09-24 MED FILL — METFORMIN 500 MG TABLET: 90 days supply | Qty: 180 | Fill #0 | Status: AC

## 2018-09-26 ENCOUNTER — Ambulatory Visit: Admit: 2018-09-26 | Discharge: 2018-09-26 | Attending: Psychiatric/Mental Health | Primary: Psychiatric/Mental Health

## 2018-09-26 ENCOUNTER — Ambulatory Visit: Admit: 2018-09-26 | Discharge: 2018-09-26 | Attending: Clinical | Primary: Clinical

## 2018-09-26 DIAGNOSIS — F4310 Post-traumatic stress disorder, unspecified: Principal | ICD-10-CM

## 2018-09-26 MED ORDER — CLONIDINE HCL 0.1 MG TABLET
ORAL_TABLET | Freq: Two times a day (BID) | ORAL | 1 refills | 0.00000 days | Status: CP
Start: 2018-09-26 — End: 2018-12-30
  Filled 2018-11-04: qty 28, 14d supply, fill #0

## 2018-09-29 MED ORDER — OMEPRAZOLE 20 MG CAPSULE,DELAYED RELEASE
ORAL_CAPSULE | ORAL | 0 refills | 0 days | Status: CP
Start: 2018-09-29 — End: 2018-10-29
  Filled 2018-09-30: qty 120, 60d supply, fill #0

## 2018-09-30 MED FILL — OMEPRAZOLE 20 MG CAPSULE,DELAYED RELEASE: 60 days supply | Qty: 120 | Fill #0 | Status: AC

## 2018-10-10 ENCOUNTER — Ambulatory Visit: Admit: 2018-10-10 | Discharge: 2018-10-11 | Attending: Psychiatric/Mental Health | Primary: Psychiatric/Mental Health

## 2018-10-10 ENCOUNTER — Ambulatory Visit: Admit: 2018-10-10 | Discharge: 2018-10-11 | Attending: Clinical | Primary: Clinical

## 2018-10-10 DIAGNOSIS — F4310 Post-traumatic stress disorder, unspecified: Principal | ICD-10-CM

## 2018-10-10 MED ORDER — PRAZOSIN 5 MG CAPSULE
ORAL_CAPSULE | Freq: Every evening | ORAL | 1 refills | 0 days | Status: CP
Start: 2018-10-10 — End: 2018-10-23

## 2018-10-23 ENCOUNTER — Ambulatory Visit: Admit: 2018-10-23 | Discharge: 2018-10-24 | Attending: Psychiatric/Mental Health | Primary: Psychiatric/Mental Health

## 2018-10-23 ENCOUNTER — Ambulatory Visit: Admit: 2018-10-23 | Discharge: 2018-10-24 | Attending: Clinical | Primary: Clinical

## 2018-10-23 DIAGNOSIS — F4310 Post-traumatic stress disorder, unspecified: Principal | ICD-10-CM

## 2018-10-23 MED ORDER — PRAZOSIN 5 MG CAPSULE
ORAL_CAPSULE | Freq: Every evening | ORAL | 1 refills | 0.00000 days | Status: CP
Start: 2018-10-23 — End: 2018-12-15
  Filled 2018-11-04: qty 14, 14d supply, fill #0

## 2018-10-23 MED ORDER — QUETIAPINE 50 MG TABLET
ORAL_TABLET | 0 refills | 0 days | Status: CP
Start: 2018-10-23 — End: 2018-12-15
  Filled 2018-11-04: qty 14, 14d supply, fill #0

## 2018-10-30 ENCOUNTER — Ambulatory Visit: Admit: 2018-10-30 | Discharge: 2018-10-31 | Attending: Psychiatric/Mental Health | Primary: Psychiatric/Mental Health

## 2018-10-30 DIAGNOSIS — F4310 Post-traumatic stress disorder, unspecified: Principal | ICD-10-CM

## 2018-10-30 MED ORDER — OMEPRAZOLE 20 MG CAPSULE,DELAYED RELEASE
ORAL_CAPSULE | ORAL | 0 refills | 0 days | Status: CP
Start: 2018-10-30 — End: 2019-02-03
  Filled 2018-12-03: qty 120, 60d supply, fill #0

## 2018-10-30 MED ORDER — LURASIDONE 80 MG TABLET
ORAL_TABLET | Freq: Every day | ORAL | 11 refills | 0 days | Status: CP
Start: 2018-10-30 — End: 2018-12-18

## 2018-11-04 MED FILL — DICLOFENAC SODIUM 75 MG TABLET,DELAYED RELEASE: ORAL | 14 days supply | Qty: 28 | Fill #2

## 2018-11-04 MED FILL — DICLOFENAC SODIUM 75 MG TABLET,DELAYED RELEASE: 14 days supply | Qty: 28 | Fill #2 | Status: AC

## 2018-11-04 MED FILL — TOPIRAMATE 25 MG TABLET: 14 days supply | Qty: 98 | Fill #1

## 2018-11-04 MED FILL — QUETIAPINE 50 MG TABLET: 14 days supply | Qty: 14 | Fill #0 | Status: AC

## 2018-11-04 MED FILL — HYDROXYCHLOROQUINE 200 MG TABLET: ORAL | 14 days supply | Qty: 28 | Fill #1

## 2018-11-04 MED FILL — HYDROXYCHLOROQUINE 200 MG TABLET: 14 days supply | Qty: 28 | Fill #1 | Status: AC

## 2018-11-04 MED FILL — PRAZOSIN 5 MG CAPSULE: 14 days supply | Qty: 14 | Fill #0 | Status: AC

## 2018-11-04 MED FILL — AMLODIPINE 5 MG TABLET: 14 days supply | Qty: 14 | Fill #1 | Status: AC

## 2018-11-04 MED FILL — TOPIRAMATE 25 MG TABLET: 14 days supply | Qty: 98 | Fill #1 | Status: AC

## 2018-11-04 MED FILL — STOOL SOFTENER-STIMULANT LAXATIVE 8.6 MG-50 MG TABLET: 14 days supply | Qty: 28 | Fill #1 | Status: AC

## 2018-11-04 MED FILL — AMLODIPINE 5 MG TABLET: ORAL | 14 days supply | Qty: 14 | Fill #1

## 2018-11-04 MED FILL — STOOL SOFTENER-STIMULANT LAXATIVE 8.6 MG-50 MG TABLET: ORAL | 14 days supply | Qty: 28 | Fill #1

## 2018-11-04 MED FILL — CLONIDINE HCL 0.1 MG TABLET: 14 days supply | Qty: 28 | Fill #0 | Status: AC

## 2018-11-06 ENCOUNTER — Ambulatory Visit: Admit: 2018-11-06 | Discharge: 2018-11-06 | Attending: Psychiatric/Mental Health | Primary: Psychiatric/Mental Health

## 2018-11-06 ENCOUNTER — Ambulatory Visit: Admit: 2018-11-06 | Discharge: 2018-11-06 | Attending: Clinical | Primary: Clinical

## 2018-11-06 DIAGNOSIS — F4310 Post-traumatic stress disorder, unspecified: Principal | ICD-10-CM

## 2018-11-13 ENCOUNTER — Ambulatory Visit: Admit: 2018-11-13 | Discharge: 2018-11-14 | Attending: Psychiatric/Mental Health | Primary: Psychiatric/Mental Health

## 2018-11-13 DIAGNOSIS — F4310 Post-traumatic stress disorder, unspecified: Principal | ICD-10-CM

## 2018-11-21 ENCOUNTER — Ambulatory Visit: Admit: 2018-11-21 | Discharge: 2018-11-22 | Attending: Psychiatric/Mental Health | Primary: Psychiatric/Mental Health

## 2018-11-21 ENCOUNTER — Ambulatory Visit: Admit: 2018-11-21 | Discharge: 2018-11-22 | Attending: Clinical | Primary: Clinical

## 2018-11-21 DIAGNOSIS — F4310 Post-traumatic stress disorder, unspecified: Principal | ICD-10-CM

## 2018-11-27 ENCOUNTER — Ambulatory Visit: Admit: 2018-11-27 | Discharge: 2018-11-28 | Attending: Clinical | Primary: Clinical

## 2018-11-27 ENCOUNTER — Ambulatory Visit: Admit: 2018-11-27 | Discharge: 2018-11-28 | Attending: Psychiatric/Mental Health | Primary: Psychiatric/Mental Health

## 2018-11-27 DIAGNOSIS — F4310 Post-traumatic stress disorder, unspecified: Principal | ICD-10-CM

## 2018-12-01 MED ORDER — SENNOSIDES 8.6 MG-DOCUSATE SODIUM 50 MG TABLET
ORAL | 1 refills | 0 days | Status: CP
Start: 2018-12-01 — End: 2019-12-01
  Filled 2018-12-03: qty 52, 26d supply, fill #0

## 2018-12-03 MED FILL — QUETIAPINE 50 MG TABLET: 16 days supply | Qty: 16 | Fill #1

## 2018-12-03 MED FILL — DICLOFENAC SODIUM 75 MG TABLET,DELAYED RELEASE: 30 days supply | Qty: 60 | Fill #3 | Status: AC

## 2018-12-03 MED FILL — HYDROXYCHLOROQUINE 200 MG TABLET: ORAL | 76 days supply | Qty: 152 | Fill #2

## 2018-12-03 MED FILL — PRAZOSIN 5 MG CAPSULE: ORAL | 46 days supply | Qty: 46 | Fill #1

## 2018-12-03 MED FILL — CLONIDINE HCL 0.1 MG TABLET: ORAL | 46 days supply | Qty: 92 | Fill #1

## 2018-12-03 MED FILL — TOPIRAMATE 25 MG TABLET: 21 days supply | Qty: 150 | Fill #2

## 2018-12-03 MED FILL — QUETIAPINE 50 MG TABLET: 16 days supply | Qty: 16 | Fill #1 | Status: AC

## 2018-12-03 MED FILL — HYDROXYCHLOROQUINE 200 MG TABLET: 76 days supply | Qty: 152 | Fill #2 | Status: AC

## 2018-12-03 MED FILL — AMLODIPINE 5 MG TABLET: ORAL | 76 days supply | Qty: 76 | Fill #2

## 2018-12-03 MED FILL — CLONIDINE HCL 0.1 MG TABLET: 46 days supply | Qty: 92 | Fill #1 | Status: AC

## 2018-12-03 MED FILL — STOOL SOFTENER-STIMULANT LAXATIVE 8.6 MG-50 MG TABLET: 26 days supply | Qty: 52 | Fill #0 | Status: AC

## 2018-12-03 MED FILL — PRAZOSIN 5 MG CAPSULE: 46 days supply | Qty: 46 | Fill #1 | Status: AC

## 2018-12-03 MED FILL — AMLODIPINE 5 MG TABLET: 76 days supply | Qty: 76 | Fill #2 | Status: AC

## 2018-12-03 MED FILL — TOPIRAMATE 25 MG TABLET: 21 days supply | Qty: 150 | Fill #2 | Status: AC

## 2018-12-03 MED FILL — OMEPRAZOLE 20 MG CAPSULE,DELAYED RELEASE: 60 days supply | Qty: 120 | Fill #0 | Status: AC

## 2018-12-03 MED FILL — DICLOFENAC SODIUM 75 MG TABLET,DELAYED RELEASE: ORAL | 30 days supply | Qty: 60 | Fill #3

## 2018-12-11 ENCOUNTER — Ambulatory Visit: Admit: 2018-12-11 | Discharge: 2018-12-12 | Attending: Family | Primary: Family

## 2018-12-11 DIAGNOSIS — Z23 Encounter for immunization: Secondary | ICD-10-CM

## 2018-12-11 DIAGNOSIS — Z8619 Personal history of other infectious and parasitic diseases: Principal | ICD-10-CM

## 2018-12-11 DIAGNOSIS — Z3200 Encounter for pregnancy test, result unknown: Secondary | ICD-10-CM

## 2018-12-15 ENCOUNTER — Ambulatory Visit: Admit: 2018-12-15 | Discharge: 2018-12-16 | Attending: Psychiatric/Mental Health | Primary: Psychiatric/Mental Health

## 2018-12-15 DIAGNOSIS — F4310 Post-traumatic stress disorder, unspecified: Principal | ICD-10-CM

## 2018-12-15 MED ORDER — QUETIAPINE 50 MG TABLET
ORAL_TABLET | 1 refills | 0 days | Status: CP
Start: 2018-12-15 — End: 2019-03-20
  Filled 2018-12-24: qty 30, 30d supply, fill #0

## 2018-12-15 MED ORDER — PRAZOSIN 5 MG CAPSULE
ORAL_CAPSULE | Freq: Every evening | ORAL | 1 refills | 0.00000 days | Status: CP
Start: 2018-12-15 — End: 2018-12-30

## 2018-12-18 MED ORDER — LURASIDONE 80 MG TABLET
ORAL_TABLET | Freq: Every day | ORAL | 3 refills | 0 days | Status: CP
Start: 2018-12-18 — End: 2019-01-12

## 2018-12-19 ENCOUNTER — Ambulatory Visit: Admit: 2018-12-19 | Discharge: 2018-12-20 | Attending: Psychiatric/Mental Health | Primary: Psychiatric/Mental Health

## 2018-12-19 DIAGNOSIS — F4310 Post-traumatic stress disorder, unspecified: Principal | ICD-10-CM

## 2018-12-23 ENCOUNTER — Ambulatory Visit: Admit: 2018-12-23 | Discharge: 2018-12-24

## 2018-12-23 DIAGNOSIS — Z79899 Other long term (current) drug therapy: Principal | ICD-10-CM

## 2018-12-23 DIAGNOSIS — Z720 Tobacco use: Principal | ICD-10-CM

## 2018-12-23 DIAGNOSIS — M0579 Rheumatoid arthritis with rheumatoid factor of multiple sites without organ or systems involvement: Principal | ICD-10-CM

## 2018-12-23 DIAGNOSIS — G43909 Migraine, unspecified, not intractable, without status migrainosus: Principal | ICD-10-CM

## 2018-12-23 DIAGNOSIS — N739 Female pelvic inflammatory disease, unspecified: Principal | ICD-10-CM

## 2018-12-23 DIAGNOSIS — M069 Rheumatoid arthritis, unspecified: Principal | ICD-10-CM

## 2018-12-23 DIAGNOSIS — F319 Bipolar disorder, unspecified: Principal | ICD-10-CM

## 2018-12-23 DIAGNOSIS — B182 Chronic viral hepatitis C: Principal | ICD-10-CM

## 2018-12-23 DIAGNOSIS — F1911 Other psychoactive substance abuse, in remission: Principal | ICD-10-CM

## 2018-12-23 MED ORDER — METHOTREXATE SODIUM 2.5 MG TABLET
ORAL_TABLET | ORAL | 3 refills | 0.00000 days | Status: CP
Start: 2018-12-23 — End: 2019-03-31
  Filled 2018-12-24: qty 24, 28d supply, fill #0

## 2018-12-23 MED ORDER — HYDROXYCHLOROQUINE 200 MG TABLET
ORAL_TABLET | ORAL | 3 refills | 0 days | Status: CP
Start: 2018-12-23 — End: 2019-12-23
  Filled 2019-02-05: qty 60, 30d supply, fill #0

## 2018-12-23 MED ORDER — FOLIC ACID 1 MG TABLET
ORAL_TABLET | Freq: Every day | ORAL | 3 refills | 0.00000 days | Status: CP
Start: 2018-12-23 — End: 2019-03-31
  Filled 2018-12-24: qty 90, 90d supply, fill #0

## 2018-12-24 MED FILL — FOLIC ACID 1 MG TABLET: 90 days supply | Qty: 90 | Fill #0 | Status: AC

## 2018-12-24 MED FILL — METHOTREXATE SODIUM 2.5 MG TABLET: 28 days supply | Qty: 24 | Fill #0 | Status: AC

## 2018-12-24 MED FILL — TOPIRAMATE 25 MG TABLET: 8 days supply | Qty: 52 | Fill #3

## 2018-12-24 MED FILL — QUETIAPINE 50 MG TABLET: 30 days supply | Qty: 30 | Fill #0 | Status: AC

## 2018-12-24 MED FILL — STOOL SOFTENER-STIMULANT LAXATIVE 8.6 MG-50 MG TABLET: 50 days supply | Qty: 100 | Fill #2 | Status: AC

## 2018-12-24 MED FILL — STOOL SOFTENER-STIMULANT LAXATIVE 8.6 MG-50 MG TABLET: ORAL | 50 days supply | Qty: 100 | Fill #2

## 2018-12-24 MED FILL — TOPIRAMATE 25 MG TABLET: 8 days supply | Qty: 52 | Fill #3 | Status: AC

## 2018-12-29 MED FILL — DICLOFENAC SODIUM 75 MG TABLET,DELAYED RELEASE: 30 days supply | Qty: 60 | Fill #4 | Status: AC

## 2018-12-29 MED FILL — DICLOFENAC SODIUM 75 MG TABLET,DELAYED RELEASE: ORAL | 30 days supply | Qty: 60 | Fill #4

## 2018-12-30 ENCOUNTER — Ambulatory Visit: Admit: 2018-12-30 | Discharge: 2018-12-31 | Attending: Psychiatric/Mental Health | Primary: Psychiatric/Mental Health

## 2018-12-30 ENCOUNTER — Ambulatory Visit: Admit: 2018-12-30 | Discharge: 2018-12-31 | Attending: Clinical | Primary: Clinical

## 2018-12-30 DIAGNOSIS — M069 Rheumatoid arthritis, unspecified: Principal | ICD-10-CM

## 2018-12-30 DIAGNOSIS — B182 Chronic viral hepatitis C: Principal | ICD-10-CM

## 2018-12-30 DIAGNOSIS — F4310 Post-traumatic stress disorder, unspecified: Principal | ICD-10-CM

## 2018-12-30 DIAGNOSIS — Z720 Tobacco use: Principal | ICD-10-CM

## 2018-12-30 DIAGNOSIS — F1911 Other psychoactive substance abuse, in remission: Principal | ICD-10-CM

## 2018-12-30 DIAGNOSIS — F319 Bipolar disorder, unspecified: Principal | ICD-10-CM

## 2018-12-30 DIAGNOSIS — N739 Female pelvic inflammatory disease, unspecified: Principal | ICD-10-CM

## 2018-12-30 DIAGNOSIS — G43909 Migraine, unspecified, not intractable, without status migrainosus: Principal | ICD-10-CM

## 2018-12-30 MED ORDER — CLONIDINE HCL 0.1 MG TABLET
ORAL_TABLET | Freq: Two times a day (BID) | ORAL | 1 refills | 0.00000 days | Status: CP
Start: 2018-12-30 — End: 2019-04-27
  Filled 2019-02-05: qty 60, 30d supply, fill #0

## 2019-01-12 MED ORDER — LURASIDONE 80 MG TABLET
ORAL_TABLET | Freq: Every day | ORAL | 11 refills | 0.00000 days | Status: CP
Start: 2019-01-12 — End: 2020-01-07

## 2019-02-03 MED ORDER — OMEPRAZOLE 20 MG CAPSULE,DELAYED RELEASE
ORAL_CAPSULE | ORAL | 0 refills | 0 days | Status: CP
Start: 2019-02-03 — End: 2019-04-27
  Filled 2019-02-05: qty 120, 60d supply, fill #0

## 2019-02-03 MED ORDER — AMLODIPINE 5 MG TABLET
ORAL_TABLET | ORAL | 3 refills | 0 days | Status: CP
Start: 2019-02-03 — End: 2020-02-03
  Filled 2019-02-05: qty 90, 90d supply, fill #0

## 2019-02-03 MED ORDER — TOPIRAMATE 25 MG TABLET
ORAL_TABLET | 2 refills | 0 days | Status: CP
Start: 2019-02-03 — End: 2019-05-26
  Filled 2019-02-05: qty 150, 21d supply, fill #0

## 2019-02-05 MED FILL — QUETIAPINE 50 MG TABLET: 30 days supply | Qty: 30 | Fill #1 | Status: AC

## 2019-02-05 MED FILL — CLONIDINE HCL 0.1 MG TABLET: 30 days supply | Qty: 60 | Fill #0 | Status: AC

## 2019-02-05 MED FILL — QUETIAPINE 50 MG TABLET: 30 days supply | Qty: 30 | Fill #1

## 2019-02-05 MED FILL — OMEPRAZOLE 20 MG CAPSULE,DELAYED RELEASE: 60 days supply | Qty: 120 | Fill #0 | Status: AC

## 2019-02-05 MED FILL — METFORMIN 500 MG TABLET: 90 days supply | Qty: 180 | Fill #1 | Status: AC

## 2019-02-05 MED FILL — TOPIRAMATE 25 MG TABLET: 21 days supply | Qty: 150 | Fill #0 | Status: AC

## 2019-02-05 MED FILL — HYDROXYCHLOROQUINE 200 MG TABLET: 30 days supply | Qty: 60 | Fill #0 | Status: AC

## 2019-02-05 MED FILL — METFORMIN 500 MG TABLET: ORAL | 90 days supply | Qty: 180 | Fill #1

## 2019-02-05 MED FILL — AMLODIPINE 5 MG TABLET: 90 days supply | Qty: 90 | Fill #0 | Status: AC

## 2019-02-06 ENCOUNTER — Telehealth: Admit: 2019-02-06 | Discharge: 2019-02-07 | Attending: Clinical | Primary: Clinical

## 2019-02-06 DIAGNOSIS — F4310 Post-traumatic stress disorder, unspecified: Principal | ICD-10-CM

## 2019-02-13 ENCOUNTER — Institutional Professional Consult (permissible substitution): Admit: 2019-02-13 | Discharge: 2019-02-14 | Attending: Clinical | Primary: Clinical

## 2019-02-13 DIAGNOSIS — F4310 Post-traumatic stress disorder, unspecified: Principal | ICD-10-CM

## 2019-03-21 MED ORDER — QUETIAPINE 50 MG TABLET
ORAL_TABLET | Freq: Every evening | ORAL | 1 refills | 30.00000 days | Status: CP | PRN
Start: 2019-03-21 — End: 2019-06-12
  Filled 2019-03-24: qty 30, 30d supply, fill #0

## 2019-03-24 MED FILL — TOPIRAMATE 25 MG TABLET: 21 days supply | Qty: 150 | Fill #1

## 2019-03-24 MED FILL — HYDROXYCHLOROQUINE 200 MG TABLET: ORAL | 30 days supply | Qty: 60 | Fill #1

## 2019-03-24 MED FILL — QUETIAPINE 50 MG TABLET: 30 days supply | Qty: 30 | Fill #0 | Status: AC

## 2019-03-24 MED FILL — CLONIDINE HCL 0.1 MG TABLET: ORAL | 30 days supply | Qty: 60 | Fill #1

## 2019-03-24 MED FILL — SENNA PLUS 8.6 MG-50 MG TABLET: 50 days supply | Qty: 100 | Fill #3 | Status: AC

## 2019-03-24 MED FILL — HYDROXYCHLOROQUINE 200 MG TABLET: 30 days supply | Qty: 60 | Fill #1 | Status: AC

## 2019-03-24 MED FILL — TOPIRAMATE 25 MG TABLET: 21 days supply | Qty: 150 | Fill #1 | Status: AC

## 2019-03-24 MED FILL — SENNA PLUS 8.6 MG-50 MG TABLET: ORAL | 50 days supply | Qty: 100 | Fill #3

## 2019-03-24 MED FILL — CLONIDINE HCL 0.1 MG TABLET: 30 days supply | Qty: 60 | Fill #1 | Status: AC

## 2019-03-31 ENCOUNTER — Institutional Professional Consult (permissible substitution): Admit: 2019-03-31 | Discharge: 2019-04-01

## 2019-03-31 DIAGNOSIS — B182 Chronic viral hepatitis C: Secondary | ICD-10-CM

## 2019-03-31 DIAGNOSIS — M059 Rheumatoid arthritis with rheumatoid factor, unspecified: Principal | ICD-10-CM

## 2019-04-13 ENCOUNTER — Telehealth: Admit: 2019-04-13 | Discharge: 2019-04-14 | Attending: Clinical | Primary: Clinical

## 2019-04-13 DIAGNOSIS — F4310 Post-traumatic stress disorder, unspecified: Principal | ICD-10-CM

## 2019-04-27 MED ORDER — OMEPRAZOLE 20 MG CAPSULE,DELAYED RELEASE
ORAL_CAPSULE | ORAL | 0 refills | 0 days | Status: CP
Start: 2019-04-27 — End: 2020-04-26
  Filled 2019-04-29: qty 120, 60d supply, fill #0

## 2019-04-28 MED ORDER — CLONIDINE HCL 0.1 MG TABLET
ORAL_TABLET | Freq: Two times a day (BID) | ORAL | 1 refills | 30.00000 days | Status: CP
Start: 2019-04-28 — End: 2019-06-12
  Filled 2019-04-29: qty 60, 30d supply, fill #0

## 2019-04-29 MED FILL — TOPIRAMATE 25 MG TABLET: 13 days supply | Qty: 150 | Fill #2

## 2019-04-29 MED FILL — OMEPRAZOLE 20 MG CAPSULE,DELAYED RELEASE: 60 days supply | Qty: 120 | Fill #0 | Status: AC

## 2019-04-29 MED FILL — HYDROXYCHLOROQUINE 200 MG TABLET: 30 days supply | Qty: 60 | Fill #2 | Status: AC

## 2019-04-29 MED FILL — METFORMIN 500 MG TABLET: ORAL | 90 days supply | Qty: 180 | Fill #2

## 2019-04-29 MED FILL — TOPIRAMATE 25 MG TABLET: 13 days supply | Qty: 150 | Fill #2 | Status: AC

## 2019-04-29 MED FILL — CLONIDINE HCL 0.1 MG TABLET: 30 days supply | Qty: 60 | Fill #0 | Status: AC

## 2019-04-29 MED FILL — METFORMIN 500 MG TABLET: 90 days supply | Qty: 180 | Fill #2 | Status: AC

## 2019-04-29 MED FILL — HYDROXYCHLOROQUINE 200 MG TABLET: ORAL | 30 days supply | Qty: 60 | Fill #2

## 2019-05-27 MED ORDER — TOPIRAMATE 25 MG TABLET
ORAL_TABLET | 2 refills | 0 days | Status: CP
Start: 2019-05-27 — End: 2020-05-26
  Filled 2019-05-28: qty 150, 20d supply, fill #0

## 2019-05-27 MED FILL — QUETIAPINE 50 MG TABLET: 30 days supply | Qty: 30 | Fill #1 | Status: AC

## 2019-05-27 MED FILL — CLONIDINE HCL 0.1 MG TABLET: ORAL | 30 days supply | Qty: 60 | Fill #1

## 2019-05-27 MED FILL — VALACYCLOVIR 500 MG TABLET: ORAL | 3 days supply | Qty: 3 | Fill #0

## 2019-05-27 MED FILL — AMLODIPINE 5 MG TABLET: ORAL | 90 days supply | Qty: 90 | Fill #1

## 2019-05-27 MED FILL — CLONIDINE HCL 0.1 MG TABLET: 30 days supply | Qty: 60 | Fill #1 | Status: AC

## 2019-05-27 MED FILL — HYDROXYCHLOROQUINE 200 MG TABLET: ORAL | 30 days supply | Qty: 60 | Fill #3

## 2019-05-27 MED FILL — QUETIAPINE 50 MG TABLET: ORAL | 30 days supply | Qty: 30 | Fill #1

## 2019-05-27 MED FILL — VALACYCLOVIR 500 MG TABLET: 3 days supply | Qty: 3 | Fill #0 | Status: AC

## 2019-05-27 MED FILL — AMLODIPINE 5 MG TABLET: 90 days supply | Qty: 90 | Fill #1 | Status: AC

## 2019-05-27 MED FILL — HYDROXYCHLOROQUINE 200 MG TABLET: 30 days supply | Qty: 60 | Fill #3 | Status: AC

## 2019-05-28 MED FILL — TOPIRAMATE 25 MG TABLET: 20 days supply | Qty: 150 | Fill #0 | Status: AC

## 2019-06-12 ENCOUNTER — Telehealth: Admit: 2019-06-12 | Discharge: 2019-06-13 | Attending: Psychiatric/Mental Health | Primary: Psychiatric/Mental Health

## 2019-06-12 DIAGNOSIS — F3175 Bipolar disorder, in partial remission, most recent episode depressed: Secondary | ICD-10-CM

## 2019-06-12 DIAGNOSIS — F4310 Post-traumatic stress disorder, unspecified: Principal | ICD-10-CM

## 2019-06-12 MED ORDER — METFORMIN 500 MG TABLET
ORAL_TABLET | Freq: Two times a day (BID) | ORAL | 0 refills | 90 days | Status: CP
Start: 2019-06-12 — End: 2019-09-10
  Filled 2019-09-08: qty 180, 90d supply, fill #0

## 2019-06-12 MED ORDER — QUETIAPINE 50 MG TABLET
ORAL_TABLET | Freq: Every evening | ORAL | 0 refills | 90 days | Status: CP | PRN
Start: 2019-06-12 — End: 2019-09-10
  Filled 2019-07-13: qty 90, 90d supply, fill #0

## 2019-06-12 MED ORDER — CLONIDINE HCL 0.1 MG TABLET
ORAL_TABLET | Freq: Three times a day (TID) | ORAL | 0 refills | 90 days | Status: CP | PRN
Start: 2019-06-12 — End: 2019-09-10
  Filled 2019-07-13: qty 270, 90d supply, fill #0

## 2019-06-17 ENCOUNTER — Telehealth: Admit: 2019-06-17 | Discharge: 2019-06-18 | Attending: Clinical | Primary: Clinical

## 2019-06-17 DIAGNOSIS — F4310 Post-traumatic stress disorder, unspecified: Principal | ICD-10-CM

## 2019-07-13 MED FILL — HYDROXYCHLOROQUINE 200 MG TABLET: 30 days supply | Qty: 60 | Fill #4 | Status: AC

## 2019-07-13 MED FILL — STOOL SOFTENER-STIMULANT LAXATIVE 8.6 MG-50 MG TABLET: ORAL | 4 days supply | Qty: 8 | Fill #1

## 2019-07-13 MED FILL — TOPIRAMATE 25 MG TABLET: 21 days supply | Qty: 150 | Fill #1

## 2019-07-13 MED FILL — QUETIAPINE 50 MG TABLET: 90 days supply | Qty: 90 | Fill #0 | Status: AC

## 2019-07-13 MED FILL — CLONIDINE HCL 0.1 MG TABLET: 90 days supply | Qty: 270 | Fill #0 | Status: AC

## 2019-07-13 MED FILL — HYDROXYCHLOROQUINE 200 MG TABLET: ORAL | 30 days supply | Qty: 60 | Fill #4

## 2019-07-13 MED FILL — STOOL SOFTENER-STIMULANT LAXATIVE 8.6 MG-50 MG TABLET: 4 days supply | Qty: 8 | Fill #1 | Status: AC

## 2019-07-13 MED FILL — TOPIRAMATE 25 MG TABLET: 21 days supply | Qty: 150 | Fill #1 | Status: AC

## 2019-08-18 ENCOUNTER — Telehealth: Admit: 2019-08-18 | Discharge: 2019-08-19

## 2019-08-18 MED ORDER — CELECOXIB 200 MG CAPSULE
ORAL_CAPSULE | Freq: Every day | ORAL | 2 refills | 15.00000 days | Status: CP
Start: 2019-08-18 — End: 2020-08-17

## 2019-08-28 ENCOUNTER — Telehealth: Admit: 2019-08-28 | Discharge: 2019-08-29 | Attending: Psychiatric/Mental Health | Primary: Psychiatric/Mental Health

## 2019-08-28 DIAGNOSIS — F4310 Post-traumatic stress disorder, unspecified: Principal | ICD-10-CM

## 2019-08-28 DIAGNOSIS — F3175 Bipolar disorder, in partial remission, most recent episode depressed: Principal | ICD-10-CM

## 2019-08-28 MED ORDER — QUETIAPINE 50 MG TABLET
ORAL_TABLET | Freq: Every evening | ORAL | 0 refills | 90.00000 days | Status: CP | PRN
Start: 2019-08-28 — End: 2019-11-26
  Filled 2019-10-09: qty 90, 90d supply, fill #0

## 2019-08-28 MED ORDER — CLONIDINE HCL 0.1 MG TABLET
ORAL_TABLET | Freq: Four times a day (QID) | ORAL | 2 refills | 30 days | Status: CP | PRN
Start: 2019-08-28 — End: 2019-11-26
  Filled 2019-10-09: qty 120, 30d supply, fill #0

## 2019-08-31 ENCOUNTER — Telehealth
Admit: 2019-08-31 | Discharge: 2019-09-01 | Attending: Marriage & Family Therapist | Primary: Marriage & Family Therapist

## 2019-09-08 MED FILL — AMLODIPINE 5 MG TABLET: ORAL | 90 days supply | Qty: 90 | Fill #2

## 2019-09-08 MED FILL — TOPIRAMATE 25 MG TABLET: 21 days supply | Qty: 150 | Fill #2 | Status: AC

## 2019-09-08 MED FILL — HYDROXYCHLOROQUINE 200 MG TABLET: 30 days supply | Qty: 60 | Fill #5 | Status: AC

## 2019-09-08 MED FILL — HYDROXYCHLOROQUINE 200 MG TABLET: ORAL | 30 days supply | Qty: 60 | Fill #5

## 2019-09-08 MED FILL — AMLODIPINE 5 MG TABLET: 90 days supply | Qty: 90 | Fill #2 | Status: AC

## 2019-09-08 MED FILL — METFORMIN 500 MG TABLET: 90 days supply | Qty: 180 | Fill #0 | Status: AC

## 2019-09-08 MED FILL — TOPIRAMATE 25 MG TABLET: 21 days supply | Qty: 150 | Fill #2

## 2019-09-11 MED ORDER — LURASIDONE 120 MG TABLET
ORAL_TABLET | Freq: Every day | ORAL | 11 refills | 30.00000 days | Status: CP
Start: 2019-09-11 — End: 2020-09-05

## 2019-09-14 ENCOUNTER — Telehealth: Admit: 2019-09-14 | Discharge: 2019-09-15 | Attending: Psychiatric/Mental Health | Primary: Psychiatric/Mental Health

## 2019-09-14 DIAGNOSIS — F4310 Post-traumatic stress disorder, unspecified: Principal | ICD-10-CM

## 2019-09-14 DIAGNOSIS — F3175 Bipolar disorder, in partial remission, most recent episode depressed: Principal | ICD-10-CM

## 2019-10-07 MED ORDER — SENNOSIDES 8.6 MG-DOCUSATE SODIUM 50 MG TABLET
ORAL | 1 refills | 0 days | Status: CP
Start: 2019-10-07 — End: 2020-10-06
  Filled 2019-10-09: qty 100, 50d supply, fill #0

## 2019-10-07 MED ORDER — OMEPRAZOLE 20 MG CAPSULE,DELAYED RELEASE
ORAL_CAPSULE | ORAL | 0 refills | 0 days | Status: CP
Start: 2019-10-07 — End: 2020-10-06
  Filled 2019-10-09: qty 120, 60d supply, fill #0

## 2019-10-07 MED ORDER — TOPIRAMATE 25 MG TABLET
ORAL_TABLET | 2 refills | 0 days | Status: CP
Start: 2019-10-07 — End: 2020-10-06
  Filled 2019-10-09: qty 150, 21d supply, fill #0

## 2019-10-08 MED ORDER — CELECOXIB 200 MG CAPSULE
ORAL_CAPSULE | Freq: Every day | ORAL | 3 refills | 90 days | Status: CP
Start: 2019-10-08 — End: 2020-10-07
  Filled 2019-10-09: qty 90, 90d supply, fill #0

## 2019-10-09 MED FILL — QUETIAPINE 50 MG TABLET: 90 days supply | Qty: 90 | Fill #0 | Status: AC

## 2019-10-09 MED FILL — TOPIRAMATE 25 MG TABLET: 21 days supply | Qty: 150 | Fill #0 | Status: AC

## 2019-10-09 MED FILL — HYDROXYCHLOROQUINE 200 MG TABLET: ORAL | 30 days supply | Qty: 60 | Fill #6

## 2019-10-09 MED FILL — OMEPRAZOLE 20 MG CAPSULE,DELAYED RELEASE: 60 days supply | Qty: 120 | Fill #0 | Status: AC

## 2019-10-09 MED FILL — HYDROXYCHLOROQUINE 200 MG TABLET: 30 days supply | Qty: 60 | Fill #6 | Status: AC

## 2019-10-09 MED FILL — STIMULANT LAXATIVE PLUS 8.6 MG-50 MG TABLET: 50 days supply | Qty: 100 | Fill #0 | Status: AC

## 2019-10-09 MED FILL — CLONIDINE HCL 0.1 MG TABLET: 30 days supply | Qty: 120 | Fill #0 | Status: AC

## 2019-10-09 MED FILL — CELECOXIB 200 MG CAPSULE: 90 days supply | Qty: 90 | Fill #0 | Status: AC

## 2019-10-12 MED ORDER — QUETIAPINE 50 MG TABLET
ORAL_TABLET | Freq: Every evening | ORAL | 0 refills | 90.00000 days | Status: CP | PRN
Start: 2019-10-12 — End: 2020-01-10

## 2019-10-12 MED ORDER — OMEPRAZOLE 20 MG CAPSULE,DELAYED RELEASE
ORAL_CAPSULE | ORAL | 0 refills | 0 days | Status: CP
Start: 2019-10-12 — End: 2020-10-11
  Filled 2019-12-24: qty 60, 30d supply, fill #0

## 2019-10-12 MED ORDER — CLONIDINE HCL 0.1 MG TABLET
ORAL_TABLET | Freq: Four times a day (QID) | ORAL | 2 refills | 30 days | Status: CP | PRN
Start: 2019-10-12 — End: 2020-01-10
  Filled 2019-11-05: qty 120, 30d supply, fill #0

## 2019-10-22 ENCOUNTER — Ambulatory Visit: Admit: 2019-10-22 | Discharge: 2019-10-23

## 2019-10-22 MED ORDER — QUETIAPINE 50 MG TABLET
ORAL_TABLET | Freq: Every evening | ORAL | 0 refills | 90.00000 days | Status: CP | PRN
Start: 2019-10-22 — End: 2020-01-20

## 2019-10-22 MED ORDER — SENNOSIDES 8.6 MG-DOCUSATE SODIUM 50 MG TABLET
Freq: Every day | ORAL | 1 refills | 15 days | Status: CP | PRN
Start: 2019-10-22 — End: 2020-10-21
  Filled 2019-12-24: qty 30, 15d supply, fill #0

## 2019-10-22 MED ORDER — MEDROXYPROGESTERONE 10 MG TABLET
ORAL_TABLET | 0 refills | 0 days | Status: CP
Start: 2019-10-22 — End: ?
  Filled 2019-12-24: qty 10, 42d supply, fill #0

## 2019-10-30 ENCOUNTER — Ambulatory Visit: Admit: 2019-10-30 | Discharge: 2019-10-31

## 2019-10-30 DIAGNOSIS — Z5329 Procedure and treatment not carried out because of patient's decision for other reasons: Principal | ICD-10-CM

## 2019-11-05 MED FILL — CLONIDINE HCL 0.1 MG TABLET: 30 days supply | Qty: 120 | Fill #0 | Status: AC

## 2019-11-05 MED FILL — HYDROXYCHLOROQUINE 200 MG TABLET: ORAL | 30 days supply | Qty: 60 | Fill #7

## 2019-11-05 MED FILL — HYDROXYCHLOROQUINE 200 MG TABLET: 30 days supply | Qty: 60 | Fill #7 | Status: AC

## 2019-11-05 MED FILL — TOPIRAMATE 25 MG TABLET: 21 days supply | Qty: 150 | Fill #1 | Status: AC

## 2019-11-05 MED FILL — TOPIRAMATE 25 MG TABLET: 21 days supply | Qty: 150 | Fill #1

## 2019-11-10 ENCOUNTER — Telehealth: Admit: 2019-11-10 | Discharge: 2019-11-11

## 2019-11-10 DIAGNOSIS — G8929 Other chronic pain: Secondary | ICD-10-CM

## 2019-11-10 DIAGNOSIS — R110 Nausea: Principal | ICD-10-CM

## 2019-11-10 DIAGNOSIS — M059 Rheumatoid arthritis with rheumatoid factor, unspecified: Principal | ICD-10-CM

## 2019-11-10 DIAGNOSIS — R7989 Other specified abnormal findings of blood chemistry: Principal | ICD-10-CM

## 2019-11-10 DIAGNOSIS — M5441 Lumbago with sciatica, right side: Secondary | ICD-10-CM

## 2019-11-11 ENCOUNTER — Telehealth: Admit: 2019-11-11 | Discharge: 2019-11-12 | Attending: Psychiatric/Mental Health | Primary: Psychiatric/Mental Health

## 2019-12-23 ENCOUNTER — Telehealth: Admit: 2019-12-23 | Discharge: 2019-12-24 | Attending: Psychiatric/Mental Health | Primary: Psychiatric/Mental Health

## 2019-12-23 DIAGNOSIS — F4310 Post-traumatic stress disorder, unspecified: Principal | ICD-10-CM

## 2019-12-23 DIAGNOSIS — F3175 Bipolar disorder, in partial remission, most recent episode depressed: Principal | ICD-10-CM

## 2019-12-23 DIAGNOSIS — N911 Secondary amenorrhea: Principal | ICD-10-CM

## 2019-12-23 MED ORDER — METFORMIN 500 MG TABLET: 500 mg | tablet | Freq: Two times a day (BID) | 0 refills | 90 days | Status: AC

## 2019-12-23 MED ORDER — LURASIDONE 120 MG TABLET
ORAL_TABLET | Freq: Every day | ORAL | 11 refills | 30.00000 days | Status: CP
Start: 2019-12-23 — End: 2020-12-17

## 2019-12-23 MED ORDER — CLONIDINE HCL 0.1 MG TABLET: 0 mg | tablet | Freq: Four times a day (QID) | 2 refills | 30 days | Status: AC

## 2019-12-23 MED ORDER — CLONIDINE HCL 0.1 MG TABLET
ORAL_TABLET | Freq: Four times a day (QID) | ORAL | 2 refills | 30.00000 days | Status: CP | PRN
Start: 2019-12-23 — End: 2020-03-22
  Filled 2019-12-24: qty 120, 30d supply, fill #0

## 2019-12-23 MED ORDER — QUETIAPINE 50 MG TABLET: 100 mg | tablet | Freq: Every evening | 0 refills | 90 days | Status: AC

## 2019-12-23 MED ORDER — QUETIAPINE 50 MG TABLET
ORAL_TABLET | Freq: Every evening | ORAL | 0 refills | 90.00000 days | Status: CP
Start: 2019-12-23 — End: 2019-12-23
  Filled 2019-12-24: qty 60, 30d supply, fill #0

## 2019-12-23 MED ORDER — METFORMIN 500 MG TABLET
ORAL_TABLET | Freq: Two times a day (BID) | ORAL | 0 refills | 90.00000 days | Status: CP
Start: 2019-12-23 — End: 2020-03-22
  Filled 2019-12-24: qty 60, 30d supply, fill #0

## 2019-12-24 MED FILL — TOPIRAMATE 25 MG TABLET: 21 days supply | Qty: 150 | Fill #2

## 2019-12-24 MED FILL — MEDROXYPROGESTERONE 10 MG TABLET: 42 days supply | Qty: 10 | Fill #0 | Status: AC

## 2019-12-24 MED FILL — STOOL SOFTENER-STIMULANT LAXATIVE 8.6 MG-50 MG TABLET: 15 days supply | Qty: 30 | Fill #0 | Status: AC

## 2019-12-24 MED FILL — TOPIRAMATE 25 MG TABLET: 21 days supply | Qty: 150 | Fill #2 | Status: AC

## 2019-12-24 MED FILL — QUETIAPINE 50 MG TABLET: 30 days supply | Qty: 60 | Fill #0 | Status: AC

## 2019-12-24 MED FILL — OMEPRAZOLE 20 MG CAPSULE,DELAYED RELEASE: 30 days supply | Qty: 60 | Fill #0 | Status: AC

## 2019-12-24 MED FILL — CLONIDINE HCL 0.1 MG TABLET: 30 days supply | Qty: 120 | Fill #0 | Status: AC

## 2019-12-24 MED FILL — METFORMIN 500 MG TABLET: 30 days supply | Qty: 60 | Fill #0 | Status: AC

## 2019-12-24 MED FILL — AMLODIPINE 5 MG TABLET: 30 days supply | Qty: 30 | Fill #3 | Status: AC

## 2019-12-24 MED FILL — AMLODIPINE 5 MG TABLET: ORAL | 30 days supply | Qty: 30 | Fill #3

## 2020-01-01 DIAGNOSIS — M059 Rheumatoid arthritis with rheumatoid factor, unspecified: Principal | ICD-10-CM

## 2020-01-01 MED ORDER — HYDROXYCHLOROQUINE 200 MG TABLET
ORAL_TABLET | Freq: Two times a day (BID) | ORAL | 3 refills | 90.00000 days | Status: CP
Start: 2020-01-01 — End: 2020-12-31
  Filled 2020-01-04: qty 60, 30d supply, fill #0

## 2020-01-04 MED FILL — HYDROXYCHLOROQUINE 200 MG TABLET: 30 days supply | Qty: 60 | Fill #0 | Status: AC

## 2020-01-08 ENCOUNTER — Telehealth: Admit: 2020-01-08 | Discharge: 2020-01-09

## 2020-01-08 DIAGNOSIS — F411 Generalized anxiety disorder: Principal | ICD-10-CM

## 2020-01-08 DIAGNOSIS — F410 Panic disorder [episodic paroxysmal anxiety] without agoraphobia: Secondary | ICD-10-CM

## 2020-01-13 ENCOUNTER — Ambulatory Visit: Admit: 2020-01-13 | Discharge: 2020-01-14 | Attending: Family Medicine | Primary: Family Medicine

## 2020-01-27 ENCOUNTER — Telehealth: Admit: 2020-01-27 | Discharge: 2020-01-28 | Attending: Psychiatric/Mental Health | Primary: Psychiatric/Mental Health

## 2020-01-27 DIAGNOSIS — N911 Secondary amenorrhea: Principal | ICD-10-CM

## 2020-01-27 DIAGNOSIS — F4310 Post-traumatic stress disorder, unspecified: Principal | ICD-10-CM

## 2020-01-27 MED ORDER — QUETIAPINE 50 MG TABLET
ORAL_TABLET | Freq: Every evening | ORAL | 0 refills | 90 days | Status: CP
Start: 2020-01-27 — End: 2020-04-26
  Filled 2020-02-05: qty 180, 90d supply, fill #0

## 2020-01-27 MED ORDER — METFORMIN 500 MG TABLET
ORAL_TABLET | Freq: Two times a day (BID) | ORAL | 0 refills | 90.00000 days | Status: CP
Start: 2020-01-27 — End: 2020-04-26
  Filled 2020-02-05: qty 180, 90d supply, fill #0

## 2020-01-27 MED ORDER — LURASIDONE 120 MG TABLET
ORAL_TABLET | Freq: Every day | ORAL | 11 refills | 30.00000 days | Status: CP
Start: 2020-01-27 — End: 2021-01-21

## 2020-01-27 MED ORDER — CLONIDINE HCL 0.1 MG TABLET
ORAL_TABLET | Freq: Four times a day (QID) | ORAL | 2 refills | 30.00000 days | Status: CP | PRN
Start: 2020-01-27 — End: 2020-04-26
  Filled 2020-02-05: qty 120, 30d supply, fill #0

## 2020-02-05 MED FILL — MEDROXYPROGESTERONE 10 MG TABLET: ORAL | 42 days supply | Qty: 10 | Fill #1

## 2020-02-05 MED FILL — HYDROXYCHLOROQUINE 200 MG TABLET: ORAL | 30 days supply | Qty: 60 | Fill #1

## 2020-02-05 MED FILL — OMEPRAZOLE 20 MG CAPSULE,DELAYED RELEASE: 30 days supply | Qty: 60 | Fill #1 | Status: AC

## 2020-02-05 MED FILL — CLONIDINE HCL 0.1 MG TABLET: 30 days supply | Qty: 120 | Fill #0 | Status: AC

## 2020-02-05 MED FILL — HYDROXYCHLOROQUINE 200 MG TABLET: 30 days supply | Qty: 60 | Fill #1 | Status: AC

## 2020-02-05 MED FILL — STOOL SOFTENER-STIMULANT LAXATIVE 8.6 MG-50 MG TABLET: ORAL | 15 days supply | Qty: 30 | Fill #1

## 2020-02-05 MED FILL — METFORMIN 500 MG TABLET: 90 days supply | Qty: 180 | Fill #0 | Status: AC

## 2020-02-05 MED FILL — CELECOXIB 200 MG CAPSULE: ORAL | 90 days supply | Qty: 90 | Fill #1

## 2020-02-05 MED FILL — CELECOXIB 200 MG CAPSULE: 90 days supply | Qty: 90 | Fill #1 | Status: AC

## 2020-02-05 MED FILL — OMEPRAZOLE 20 MG CAPSULE,DELAYED RELEASE: ORAL | 30 days supply | Qty: 60 | Fill #1

## 2020-02-05 MED FILL — STOOL SOFTENER-STIMULANT LAXATIVE 8.6 MG-50 MG TABLET: 15 days supply | Qty: 30 | Fill #1 | Status: AC

## 2020-02-05 MED FILL — MEDROXYPROGESTERONE 10 MG TABLET: 42 days supply | Qty: 10 | Fill #1 | Status: AC

## 2020-02-05 MED FILL — QUETIAPINE 50 MG TABLET: 90 days supply | Qty: 180 | Fill #0 | Status: AC

## 2020-02-10 ENCOUNTER — Telehealth: Admit: 2020-02-10 | Discharge: 2020-02-11 | Attending: Psychiatric/Mental Health | Primary: Psychiatric/Mental Health

## 2020-02-10 DIAGNOSIS — F17200 Nicotine dependence, unspecified, uncomplicated: Principal | ICD-10-CM

## 2020-02-10 MED ORDER — VARENICLINE 0.5 MG (11)-1 MG (42) TABLETS IN A DOSE PACK
0 refills | 0 days | Status: CP
Start: 2020-02-10 — End: 2020-05-10

## 2020-02-10 MED ORDER — NICOTINE (POLACRILEX) 4 MG BUCCAL LOZENGE
LOZENGE | Freq: Four times a day (QID) | BUCCAL | 1 refills | 30.00000 days | Status: CP
Start: 2020-02-10 — End: 2020-04-10
  Filled 2020-02-24: qty 144, 36d supply, fill #0

## 2020-02-11 DIAGNOSIS — M05762 Rheumatoid arthritis with rheumatoid factor of left knee without organ or systems involvement: Principal | ICD-10-CM

## 2020-02-11 DIAGNOSIS — M05761 Rheumatoid arthritis with rheumatoid factor of right knee without organ or systems involvement: Principal | ICD-10-CM

## 2020-02-24 MED FILL — CHANTIX STARTING MONTH BOX 0.5 MG (11)-1 MG (42) TABLETS IN DOSE PACK: 30 days supply | Qty: 53 | Fill #0

## 2020-02-24 MED FILL — NICOTINE (POLACRILEX) 4 MG BUCCAL LOZENGE: 36 days supply | Qty: 144 | Fill #0 | Status: AC

## 2020-02-24 MED FILL — CHANTIX STARTING MONTH BOX 0.5 MG (11)-1 MG (42) TABLETS IN DOSE PACK: 30 days supply | Qty: 53 | Fill #0 | Status: AC

## 2020-03-10 ENCOUNTER — Telehealth: Admit: 2020-03-10 | Discharge: 2020-03-11 | Attending: Psychiatric/Mental Health | Primary: Psychiatric/Mental Health

## 2020-03-10 MED ORDER — VARENICLINE 1 MG TABLET
ORAL_TABLET | Freq: Two times a day (BID) | ORAL | 0 refills | 90 days | Status: CP
Start: 2020-03-10 — End: 2020-06-08

## 2020-03-23 DIAGNOSIS — N911 Secondary amenorrhea: Principal | ICD-10-CM

## 2020-03-23 MED ORDER — SENNOSIDES 8.6 MG-DOCUSATE SODIUM 50 MG TABLET
ORAL_TABLET | Freq: Every day | ORAL | 0 refills | 30 days | Status: CP | PRN
Start: 2020-03-23 — End: 2021-03-23
  Filled 2020-03-25: qty 60, 30d supply, fill #0

## 2020-03-23 MED ORDER — OMEPRAZOLE 20 MG CAPSULE,DELAYED RELEASE
ORAL_CAPSULE | ORAL | 0 refills | 30 days | Status: CP
Start: 2020-03-23 — End: 2020-04-22
  Filled 2020-03-25: qty 60, 30d supply, fill #0

## 2020-03-24 MED FILL — HYDROXYCHLOROQUINE 200 MG TABLET: 30 days supply | Qty: 60 | Fill #2 | Status: AC

## 2020-03-24 MED FILL — MEDROXYPROGESTERONE 10 MG TABLET: ORAL | 42 days supply | Qty: 10 | Fill #2

## 2020-03-24 MED FILL — CLONIDINE HCL 0.1 MG TABLET: ORAL | 30 days supply | Qty: 120 | Fill #1

## 2020-03-24 MED FILL — MEDROXYPROGESTERONE 10 MG TABLET: 42 days supply | Qty: 10 | Fill #2 | Status: AC

## 2020-03-24 MED FILL — CLONIDINE HCL 0.1 MG TABLET: 30 days supply | Qty: 120 | Fill #1 | Status: AC

## 2020-03-24 MED FILL — HYDROXYCHLOROQUINE 200 MG TABLET: ORAL | 30 days supply | Qty: 60 | Fill #2

## 2020-03-25 MED FILL — STOOL SOFTENER-STIMULANT LAXATIVE 8.6 MG-50 MG TABLET: 30 days supply | Qty: 60 | Fill #0 | Status: AC

## 2020-03-25 MED FILL — OMEPRAZOLE 20 MG CAPSULE,DELAYED RELEASE: 30 days supply | Qty: 60 | Fill #0 | Status: AC

## 2020-04-08 ENCOUNTER — Institutional Professional Consult (permissible substitution): Admit: 2020-04-08 | Discharge: 2020-04-09 | Payer: PRIVATE HEALTH INSURANCE

## 2020-04-08 MED ORDER — TOPIRAMATE 25 MG TABLET
ORAL_TABLET | 2 refills | 0 days | Status: CP
Start: 2020-04-08 — End: ?
  Filled 2020-05-04: qty 120, 40d supply, fill #0

## 2020-04-13 ENCOUNTER — Telehealth
Admit: 2020-04-13 | Discharge: 2020-04-14 | Payer: PRIVATE HEALTH INSURANCE | Attending: Psychiatric/Mental Health | Primary: Psychiatric/Mental Health

## 2020-04-13 DIAGNOSIS — F4310 Post-traumatic stress disorder, unspecified: Principal | ICD-10-CM

## 2020-04-13 DIAGNOSIS — F17200 Nicotine dependence, unspecified, uncomplicated: Principal | ICD-10-CM

## 2020-05-04 DIAGNOSIS — N911 Secondary amenorrhea: Principal | ICD-10-CM

## 2020-05-04 MED ORDER — QUETIAPINE 50 MG TABLET
ORAL_TABLET | Freq: Every evening | ORAL | 0 refills | 90 days | Status: CP
Start: 2020-05-04 — End: 2020-08-02
  Filled 2020-05-06: qty 180, 90d supply, fill #0

## 2020-05-04 MED ORDER — AMLODIPINE 5 MG TABLET
ORAL_TABLET | ORAL | 3 refills | 0 days
Start: 2020-05-04 — End: 2021-05-04

## 2020-05-04 MED ORDER — METFORMIN 500 MG TABLET
ORAL_TABLET | Freq: Two times a day (BID) | ORAL | 0 refills | 90 days | Status: CP
Start: 2020-05-04 — End: 2020-08-02
  Filled 2020-05-06: qty 180, 90d supply, fill #0

## 2020-05-04 MED ORDER — SENNOSIDES 8.6 MG-DOCUSATE SODIUM 50 MG TABLET
ORAL_TABLET | Freq: Every day | ORAL | 3 refills | 30 days | Status: CP | PRN
Start: 2020-05-04 — End: 2021-05-04
  Filled 2020-05-06: qty 60, 30d supply, fill #0

## 2020-05-04 MED ORDER — OMEPRAZOLE 20 MG CAPSULE,DELAYED RELEASE
ORAL_CAPSULE | ORAL | 0 refills | 30 days
Start: 2020-05-04 — End: 2020-06-03

## 2020-05-04 MED FILL — CELECOXIB 200 MG CAPSULE: ORAL | 90 days supply | Qty: 90 | Fill #2

## 2020-05-04 MED FILL — CLONIDINE HCL 0.1 MG TABLET: ORAL | 30 days supply | Qty: 120 | Fill #2

## 2020-05-04 MED FILL — TOPIRAMATE 25 MG TABLET: 40 days supply | Qty: 120 | Fill #0 | Status: AC

## 2020-05-04 MED FILL — CLONIDINE HCL 0.1 MG TABLET: 30 days supply | Qty: 120 | Fill #2 | Status: AC

## 2020-05-04 MED FILL — HYDROXYCHLOROQUINE 200 MG TABLET: 30 days supply | Qty: 60 | Fill #3 | Status: AC

## 2020-05-04 MED FILL — HYDROXYCHLOROQUINE 200 MG TABLET: ORAL | 30 days supply | Qty: 60 | Fill #3

## 2020-05-04 MED FILL — CELECOXIB 200 MG CAPSULE: 90 days supply | Qty: 90 | Fill #2 | Status: AC

## 2020-05-05 MED ORDER — OMEPRAZOLE 20 MG CAPSULE,DELAYED RELEASE
ORAL_CAPSULE | ORAL | 0 refills | 30 days
Start: 2020-05-05 — End: 2020-06-04

## 2020-05-05 MED ORDER — AMLODIPINE 5 MG TABLET
ORAL_TABLET | ORAL | 3 refills | 0 days
Start: 2020-05-05 — End: 2021-05-05

## 2020-05-06 MED FILL — QUETIAPINE 50 MG TABLET: 90 days supply | Qty: 180 | Fill #0 | Status: AC

## 2020-05-06 MED FILL — METFORMIN 500 MG TABLET: 90 days supply | Qty: 180 | Fill #0 | Status: AC

## 2020-05-06 MED FILL — STOOL SOFTENER-STIMULANT LAXATIVE 8.6 MG-50 MG TABLET: 30 days supply | Qty: 60 | Fill #0 | Status: AC

## 2020-05-08 MED ORDER — AMLODIPINE 5 MG TABLET
ORAL_TABLET | ORAL | 3 refills | 0 days | Status: CP
Start: 2020-05-08 — End: 2021-05-08
  Filled 2020-05-10: qty 90, 90d supply, fill #0

## 2020-05-08 MED ORDER — OMEPRAZOLE 20 MG CAPSULE,DELAYED RELEASE
ORAL_CAPSULE | ORAL | 0 refills | 30 days | Status: CP
Start: 2020-05-08 — End: 2020-06-07
  Filled 2020-05-10: qty 60, 30d supply, fill #0

## 2020-05-10 MED FILL — AMLODIPINE 5 MG TABLET: 90 days supply | Qty: 90 | Fill #0 | Status: AC

## 2020-05-10 MED FILL — OMEPRAZOLE 20 MG CAPSULE,DELAYED RELEASE: 30 days supply | Qty: 60 | Fill #0 | Status: AC

## 2020-05-13 ENCOUNTER — Ambulatory Visit: Admit: 2020-05-13 | Discharge: 2020-05-14 | Payer: PRIVATE HEALTH INSURANCE

## 2020-05-13 DIAGNOSIS — B182 Chronic viral hepatitis C: Principal | ICD-10-CM

## 2020-05-13 DIAGNOSIS — Z7189 Other specified counseling: Principal | ICD-10-CM

## 2020-05-13 DIAGNOSIS — Z720 Tobacco use: Principal | ICD-10-CM

## 2020-05-13 DIAGNOSIS — M059 Rheumatoid arthritis with rheumatoid factor, unspecified: Principal | ICD-10-CM

## 2020-05-13 DIAGNOSIS — Z79899 Other long term (current) drug therapy: Principal | ICD-10-CM

## 2020-05-13 MED ORDER — HYDROXYCHLOROQUINE 200 MG TABLET
ORAL_TABLET | Freq: Two times a day (BID) | ORAL | 3 refills | 90.00000 days | Status: CP
Start: 2020-05-13 — End: 2021-05-13
  Filled 2020-06-01: qty 180, 90d supply, fill #0

## 2020-05-13 MED ORDER — METHOTREXATE SODIUM 2.5 MG TABLET
ORAL_TABLET | ORAL | 3 refills | 28 days | Status: CP
Start: 2020-05-13 — End: ?
  Filled 2020-06-01: qty 24, 28d supply, fill #0

## 2020-05-13 MED ORDER — CELECOXIB 200 MG CAPSULE
ORAL_CAPSULE | Freq: Every day | ORAL | 3 refills | 90.00000 days | Status: CP
Start: 2020-05-13 — End: 2021-05-13
  Filled 2020-07-28: qty 90, 90d supply, fill #0

## 2020-05-13 MED ORDER — FOLIC ACID 1 MG TABLET
ORAL_TABLET | Freq: Every day | ORAL | 3 refills | 90 days | Status: CP
Start: 2020-05-13 — End: 2021-05-13
  Filled 2020-06-01: qty 90, 90d supply, fill #0

## 2020-05-18 ENCOUNTER — Telehealth
Admit: 2020-05-18 | Discharge: 2020-05-19 | Payer: MEDICAID | Attending: Psychiatric/Mental Health | Primary: Psychiatric/Mental Health

## 2020-05-18 DIAGNOSIS — F4310 Post-traumatic stress disorder, unspecified: Principal | ICD-10-CM

## 2020-05-31 DIAGNOSIS — K219 Gastro-esophageal reflux disease without esophagitis: Principal | ICD-10-CM

## 2020-05-31 MED ORDER — CLONIDINE HCL 0.1 MG TABLET
ORAL_TABLET | Freq: Four times a day (QID) | ORAL | 2 refills | 30.00000 days | PRN
Start: 2020-05-31 — End: 2020-08-29

## 2020-05-31 MED ORDER — OMEPRAZOLE 20 MG CAPSULE,DELAYED RELEASE
ORAL_CAPSULE | ORAL | 0 refills | 30 days | Status: CP
Start: 2020-05-31 — End: 2020-06-30
  Filled 2020-06-03: qty 60, 30d supply, fill #0

## 2020-06-01 MED FILL — FOLIC ACID 1 MG TABLET: 90 days supply | Qty: 90 | Fill #0 | Status: AC

## 2020-06-01 MED FILL — METHOTREXATE SODIUM 2.5 MG TABLET: 28 days supply | Qty: 24 | Fill #0 | Status: AC

## 2020-06-01 MED FILL — HYDROXYCHLOROQUINE 200 MG TABLET: 90 days supply | Qty: 180 | Fill #0 | Status: AC

## 2020-06-01 MED FILL — STOOL SOFTENER-STIMULANT LAXATIVE 8.6 MG-50 MG TABLET: ORAL | 30 days supply | Qty: 60 | Fill #1

## 2020-06-01 MED FILL — STOOL SOFTENER-STIMULANT LAXATIVE 8.6 MG-50 MG TABLET: 30 days supply | Qty: 60 | Fill #1 | Status: AC

## 2020-06-03 MED FILL — OMEPRAZOLE 20 MG CAPSULE,DELAYED RELEASE: 30 days supply | Qty: 60 | Fill #0 | Status: AC

## 2020-06-03 MED FILL — CLONIDINE HCL 0.1 MG TABLET: 30 days supply | Qty: 120 | Fill #0

## 2020-06-03 MED FILL — CLONIDINE HCL 0.1 MG TABLET: 30 days supply | Qty: 120 | Fill #0 | Status: AC

## 2020-06-08 ENCOUNTER — Telehealth
Admit: 2020-06-08 | Discharge: 2020-06-09 | Payer: MEDICAID | Attending: Psychiatric/Mental Health | Primary: Psychiatric/Mental Health

## 2020-06-08 DIAGNOSIS — N911 Secondary amenorrhea: Principal | ICD-10-CM

## 2020-06-08 MED ORDER — CLONIDINE HCL 0.1 MG TABLET
ORAL_TABLET | 2 refills | 0 days | Status: CP
Start: 2020-06-08 — End: ?
  Filled 2020-06-30: qty 120, 30d supply, fill #0

## 2020-06-08 MED ORDER — QUETIAPINE 50 MG TABLET
ORAL_TABLET | Freq: Every evening | ORAL | 0 refills | 90.00000 days | Status: CP
Start: 2020-06-08 — End: 2020-09-06
  Filled 2020-08-25: qty 180, 90d supply, fill #0

## 2020-06-13 ENCOUNTER — Telehealth
Admit: 2020-06-13 | Discharge: 2020-06-14 | Payer: MEDICAID | Attending: Addiction (Substance Use Disorder) | Primary: Addiction (Substance Use Disorder)

## 2020-06-13 DIAGNOSIS — F419 Anxiety disorder, unspecified: Principal | ICD-10-CM

## 2020-06-13 DIAGNOSIS — F4310 Post-traumatic stress disorder, unspecified: Principal | ICD-10-CM

## 2020-06-21 ENCOUNTER — Telehealth
Admit: 2020-06-21 | Discharge: 2020-06-22 | Payer: MEDICAID | Attending: Addiction (Substance Use Disorder) | Primary: Addiction (Substance Use Disorder)

## 2020-06-21 DIAGNOSIS — F419 Anxiety disorder, unspecified: Principal | ICD-10-CM

## 2020-06-21 DIAGNOSIS — F4310 Post-traumatic stress disorder, unspecified: Principal | ICD-10-CM

## 2020-06-28 ENCOUNTER — Telehealth
Admit: 2020-06-28 | Discharge: 2020-06-29 | Payer: MEDICAID | Attending: Addiction (Substance Use Disorder) | Primary: Addiction (Substance Use Disorder)

## 2020-06-28 DIAGNOSIS — F4310 Post-traumatic stress disorder, unspecified: Principal | ICD-10-CM

## 2020-06-28 DIAGNOSIS — F419 Anxiety disorder, unspecified: Principal | ICD-10-CM

## 2020-06-30 MED FILL — TOPIRAMATE 25 MG TABLET: 30 days supply | Qty: 120 | Fill #1 | Status: AC

## 2020-06-30 MED FILL — METHOTREXATE SODIUM 2.5 MG TABLET: ORAL | 28 days supply | Qty: 24 | Fill #1

## 2020-06-30 MED FILL — STOOL SOFTENER-STIMULANT LAXATIVE 8.6 MG-50 MG TABLET: 30 days supply | Qty: 60 | Fill #2 | Status: AC

## 2020-06-30 MED FILL — CLONIDINE HCL 0.1 MG TABLET: 30 days supply | Qty: 120 | Fill #0 | Status: AC

## 2020-06-30 MED FILL — STOOL SOFTENER-STIMULANT LAXATIVE 8.6 MG-50 MG TABLET: ORAL | 30 days supply | Qty: 60 | Fill #2

## 2020-06-30 MED FILL — METHOTREXATE SODIUM 2.5 MG TABLET: 28 days supply | Qty: 24 | Fill #1 | Status: AC

## 2020-06-30 MED FILL — TOPIRAMATE 25 MG TABLET: 30 days supply | Qty: 120 | Fill #1

## 2020-07-05 ENCOUNTER — Telehealth
Admit: 2020-07-05 | Discharge: 2020-07-06 | Payer: MEDICAID | Attending: Addiction (Substance Use Disorder) | Primary: Addiction (Substance Use Disorder)

## 2020-07-05 DIAGNOSIS — F411 Generalized anxiety disorder: Principal | ICD-10-CM

## 2020-07-05 DIAGNOSIS — F4310 Post-traumatic stress disorder, unspecified: Principal | ICD-10-CM

## 2020-07-12 ENCOUNTER — Telehealth
Admit: 2020-07-12 | Discharge: 2020-07-13 | Payer: MEDICAID | Attending: Addiction (Substance Use Disorder) | Primary: Addiction (Substance Use Disorder)

## 2020-07-12 DIAGNOSIS — F329 Major depressive disorder, single episode, unspecified: Principal | ICD-10-CM

## 2020-07-12 DIAGNOSIS — F411 Generalized anxiety disorder: Principal | ICD-10-CM

## 2020-07-14 ENCOUNTER — Telehealth
Admit: 2020-07-14 | Discharge: 2020-07-15 | Payer: MEDICAID | Attending: Psychiatric/Mental Health | Primary: Psychiatric/Mental Health

## 2020-07-14 DIAGNOSIS — Z7189 Other specified counseling: Principal | ICD-10-CM

## 2020-07-14 DIAGNOSIS — F329 Major depressive disorder, single episode, unspecified: Principal | ICD-10-CM

## 2020-07-14 DIAGNOSIS — F4310 Post-traumatic stress disorder, unspecified: Principal | ICD-10-CM

## 2020-07-14 DIAGNOSIS — F17200 Nicotine dependence, unspecified, uncomplicated: Principal | ICD-10-CM

## 2020-07-19 ENCOUNTER — Ambulatory Visit
Admit: 2020-07-19 | Payer: MEDICAID | Attending: Addiction (Substance Use Disorder) | Primary: Addiction (Substance Use Disorder)

## 2020-07-26 ENCOUNTER — Telehealth
Admit: 2020-07-26 | Discharge: 2020-07-27 | Payer: MEDICAID | Attending: Addiction (Substance Use Disorder) | Primary: Addiction (Substance Use Disorder)

## 2020-07-26 DIAGNOSIS — F319 Bipolar disorder, unspecified: Principal | ICD-10-CM

## 2020-07-26 DIAGNOSIS — F4310 Post-traumatic stress disorder, unspecified: Principal | ICD-10-CM

## 2020-07-26 DIAGNOSIS — F419 Anxiety disorder, unspecified: Principal | ICD-10-CM

## 2020-07-28 MED FILL — STOOL SOFTENER-STIMULANT LAXATIVE 8.6 MG-50 MG TABLET: 30 days supply | Qty: 60 | Fill #3 | Status: AC

## 2020-07-28 MED FILL — CLONIDINE HCL 0.1 MG TABLET: 30 days supply | Qty: 120 | Fill #1 | Status: AC

## 2020-07-28 MED FILL — CLONIDINE HCL 0.1 MG TABLET: 30 days supply | Qty: 120 | Fill #1

## 2020-07-28 MED FILL — STOOL SOFTENER-STIMULANT LAXATIVE 8.6 MG-50 MG TABLET: ORAL | 30 days supply | Qty: 60 | Fill #3

## 2020-07-28 MED FILL — CELECOXIB 200 MG CAPSULE: 90 days supply | Qty: 90 | Fill #0 | Status: AC

## 2020-08-02 ENCOUNTER — Telehealth
Admit: 2020-08-02 | Discharge: 2020-08-03 | Payer: MEDICAID | Attending: Addiction (Substance Use Disorder) | Primary: Addiction (Substance Use Disorder)

## 2020-08-02 DIAGNOSIS — F4310 Post-traumatic stress disorder, unspecified: Principal | ICD-10-CM

## 2020-08-02 DIAGNOSIS — F319 Bipolar disorder, unspecified: Principal | ICD-10-CM

## 2020-08-02 DIAGNOSIS — F411 Generalized anxiety disorder: Principal | ICD-10-CM

## 2020-08-04 ENCOUNTER — Ambulatory Visit: Admit: 2020-08-04 | Payer: MEDICAID | Attending: Psychiatric/Mental Health | Primary: Psychiatric/Mental Health

## 2020-08-05 ENCOUNTER — Telehealth
Admit: 2020-08-05 | Discharge: 2020-08-06 | Payer: MEDICAID | Attending: Psychiatric/Mental Health | Primary: Psychiatric/Mental Health

## 2020-08-05 DIAGNOSIS — Z7189 Other specified counseling: Principal | ICD-10-CM

## 2020-08-05 DIAGNOSIS — F4310 Post-traumatic stress disorder, unspecified: Principal | ICD-10-CM

## 2020-08-05 DIAGNOSIS — F319 Bipolar disorder, unspecified: Principal | ICD-10-CM

## 2020-08-09 ENCOUNTER — Telehealth
Admit: 2020-08-09 | Discharge: 2020-08-10 | Payer: MEDICAID | Attending: Addiction (Substance Use Disorder) | Primary: Addiction (Substance Use Disorder)

## 2020-08-09 DIAGNOSIS — F4310 Post-traumatic stress disorder, unspecified: Principal | ICD-10-CM

## 2020-08-09 DIAGNOSIS — F319 Bipolar disorder, unspecified: Principal | ICD-10-CM

## 2020-08-09 DIAGNOSIS — F411 Generalized anxiety disorder: Principal | ICD-10-CM

## 2020-08-16 ENCOUNTER — Telehealth
Admit: 2020-08-16 | Discharge: 2020-08-17 | Payer: MEDICAID | Attending: Addiction (Substance Use Disorder) | Primary: Addiction (Substance Use Disorder)

## 2020-08-16 DIAGNOSIS — F319 Bipolar disorder, unspecified: Principal | ICD-10-CM

## 2020-08-16 DIAGNOSIS — F4310 Post-traumatic stress disorder, unspecified: Principal | ICD-10-CM

## 2020-08-16 DIAGNOSIS — F411 Generalized anxiety disorder: Principal | ICD-10-CM

## 2020-08-25 DIAGNOSIS — N911 Secondary amenorrhea: Principal | ICD-10-CM

## 2020-08-25 MED ORDER — SENNOSIDES 8.6 MG-DOCUSATE SODIUM 50 MG TABLET
ORAL_TABLET | Freq: Every day | ORAL | 3 refills | 30 days | Status: CP | PRN
Start: 2020-08-25 — End: 2021-08-25
  Filled 2020-08-29: qty 60, 30d supply, fill #0

## 2020-08-25 MED FILL — TOPIRAMATE 25 MG TABLET: 30 days supply | Qty: 120 | Fill #2 | Status: AC

## 2020-08-25 MED FILL — AMLODIPINE 5 MG TABLET: 90 days supply | Qty: 90 | Fill #1 | Status: AC

## 2020-08-25 MED FILL — CLONIDINE HCL 0.1 MG TABLET: 30 days supply | Qty: 120 | Fill #2 | Status: AC

## 2020-08-25 MED FILL — TOPIRAMATE 25 MG TABLET: 30 days supply | Qty: 120 | Fill #2

## 2020-08-25 MED FILL — AMLODIPINE 5 MG TABLET: ORAL | 90 days supply | Qty: 90 | Fill #1

## 2020-08-25 MED FILL — QUETIAPINE 50 MG TABLET: 90 days supply | Qty: 180 | Fill #0 | Status: AC

## 2020-08-26 ENCOUNTER — Telehealth
Admit: 2020-08-26 | Discharge: 2020-08-27 | Payer: MEDICAID | Attending: Psychiatric/Mental Health | Primary: Psychiatric/Mental Health

## 2020-08-26 DIAGNOSIS — Z7189 Other specified counseling: Principal | ICD-10-CM

## 2020-08-26 DIAGNOSIS — F4310 Post-traumatic stress disorder, unspecified: Principal | ICD-10-CM

## 2020-08-26 DIAGNOSIS — N911 Secondary amenorrhea: Principal | ICD-10-CM

## 2020-08-26 MED ORDER — QUETIAPINE 50 MG TABLET
ORAL_TABLET | Freq: Every evening | ORAL | 0 refills | 90.00000 days | Status: CP
Start: 2020-08-26 — End: 2020-11-24

## 2020-08-26 MED ORDER — CLONIDINE HCL 0.1 MG TABLET
ORAL_TABLET | ORAL | 2 refills | 0.00000 days | Status: CP
Start: 2020-08-26 — End: 2020-09-23
  Filled 2020-08-25: qty 120, 30d supply, fill #2
  Filled 2020-09-21: qty 120, 30d supply, fill #0

## 2020-08-26 MED ORDER — METFORMIN 500 MG TABLET
ORAL_TABLET | Freq: Two times a day (BID) | ORAL | 0 refills | 90 days | Status: CP
Start: 2020-08-26 — End: 2020-11-24

## 2020-08-29 ENCOUNTER — Telehealth
Admit: 2020-08-29 | Discharge: 2020-08-30 | Payer: MEDICAID | Attending: Addiction (Substance Use Disorder) | Primary: Addiction (Substance Use Disorder)

## 2020-08-29 DIAGNOSIS — F4310 Post-traumatic stress disorder, unspecified: Principal | ICD-10-CM

## 2020-08-29 DIAGNOSIS — F319 Bipolar disorder, unspecified: Principal | ICD-10-CM

## 2020-08-29 DIAGNOSIS — F411 Generalized anxiety disorder: Principal | ICD-10-CM

## 2020-08-29 MED FILL — STOOL SOFTENER-STIMULANT LAXATIVE 8.6 MG-50 MG TABLET: 30 days supply | Qty: 60 | Fill #0 | Status: AC

## 2020-09-07 ENCOUNTER — Telehealth
Admit: 2020-09-07 | Discharge: 2020-09-08 | Payer: MEDICAID | Attending: Addiction (Substance Use Disorder) | Primary: Addiction (Substance Use Disorder)

## 2020-09-07 DIAGNOSIS — F411 Generalized anxiety disorder: Principal | ICD-10-CM

## 2020-09-07 DIAGNOSIS — F4310 Post-traumatic stress disorder, unspecified: Principal | ICD-10-CM

## 2020-09-07 DIAGNOSIS — F319 Bipolar disorder, unspecified: Principal | ICD-10-CM

## 2020-09-14 ENCOUNTER — Telehealth
Admit: 2020-09-14 | Discharge: 2020-09-15 | Payer: MEDICAID | Attending: Addiction (Substance Use Disorder) | Primary: Addiction (Substance Use Disorder)

## 2020-09-14 DIAGNOSIS — F411 Generalized anxiety disorder: Principal | ICD-10-CM

## 2020-09-14 DIAGNOSIS — F4310 Post-traumatic stress disorder, unspecified: Principal | ICD-10-CM

## 2020-09-14 DIAGNOSIS — F319 Bipolar disorder, unspecified: Principal | ICD-10-CM

## 2020-09-21 DIAGNOSIS — G43109 Migraine with aura, not intractable, without status migrainosus: Principal | ICD-10-CM

## 2020-09-21 MED FILL — CLONIDINE HCL 0.1 MG TABLET: 30 days supply | Qty: 120 | Fill #0 | Status: AC

## 2020-09-21 MED FILL — STOOL SOFTENER-STIMULANT LAXATIVE 8.6 MG-50 MG TABLET: ORAL | 30 days supply | Qty: 60 | Fill #1

## 2020-09-21 MED FILL — STOOL SOFTENER-STIMULANT LAXATIVE 8.6 MG-50 MG TABLET: 30 days supply | Qty: 60 | Fill #1 | Status: AC

## 2020-09-22 MED ORDER — TOPIRAMATE 25 MG TABLET
ORAL_TABLET | Freq: Two times a day (BID) | ORAL | 2 refills | 30 days | Status: CP
Start: 2020-09-22 — End: ?
  Filled 2020-09-23: qty 120, 30d supply, fill #0

## 2020-09-23 ENCOUNTER — Telehealth
Admit: 2020-09-23 | Discharge: 2020-09-24 | Payer: MEDICAID | Attending: Psychiatric/Mental Health | Primary: Psychiatric/Mental Health

## 2020-09-23 DIAGNOSIS — G4700 Insomnia, unspecified: Principal | ICD-10-CM

## 2020-09-23 DIAGNOSIS — F4310 Post-traumatic stress disorder, unspecified: Principal | ICD-10-CM

## 2020-09-23 MED ORDER — CLONIDINE HCL 0.1 MG TABLET
ORAL_TABLET | Freq: Four times a day (QID) | ORAL | 0 refills | 90.00000 days | Status: CP
Start: 2020-09-23 — End: 2020-12-22
  Filled 2020-10-17: qty 360, 90d supply, fill #0

## 2020-09-23 MED FILL — TOPIRAMATE 25 MG TABLET: 30 days supply | Qty: 120 | Fill #0 | Status: AC

## 2020-10-11 ENCOUNTER — Telehealth
Admit: 2020-10-11 | Discharge: 2020-10-12 | Payer: MEDICAID | Attending: Psychiatric/Mental Health | Primary: Psychiatric/Mental Health

## 2020-10-11 DIAGNOSIS — F4310 Post-traumatic stress disorder, unspecified: Principal | ICD-10-CM

## 2020-10-11 DIAGNOSIS — Z7189 Other specified counseling: Principal | ICD-10-CM

## 2020-10-17 MED FILL — CLONIDINE HCL 0.1 MG TABLET: 90 days supply | Qty: 360 | Fill #0 | Status: AC

## 2020-11-10 ENCOUNTER — Telehealth
Admit: 2020-11-10 | Discharge: 2020-11-11 | Payer: MEDICAID | Attending: Psychiatric/Mental Health | Primary: Psychiatric/Mental Health

## 2020-11-10 DIAGNOSIS — F4310 Post-traumatic stress disorder, unspecified: Principal | ICD-10-CM

## 2020-12-08 ENCOUNTER — Telehealth
Admit: 2020-12-08 | Discharge: 2020-12-09 | Payer: MEDICAID | Attending: Psychiatric/Mental Health | Primary: Psychiatric/Mental Health

## 2020-12-08 DIAGNOSIS — F17200 Nicotine dependence, unspecified, uncomplicated: Principal | ICD-10-CM

## 2020-12-08 DIAGNOSIS — F32A Depression, unspecified depression type: Principal | ICD-10-CM

## 2020-12-08 DIAGNOSIS — F411 Generalized anxiety disorder: Principal | ICD-10-CM

## 2020-12-21 ENCOUNTER — Telehealth
Admit: 2020-12-21 | Discharge: 2020-12-22 | Payer: MEDICAID | Attending: Addiction (Substance Use Disorder) | Primary: Addiction (Substance Use Disorder)

## 2020-12-21 DIAGNOSIS — F411 Generalized anxiety disorder: Principal | ICD-10-CM

## 2020-12-21 DIAGNOSIS — F4310 Post-traumatic stress disorder, unspecified: Principal | ICD-10-CM

## 2020-12-21 DIAGNOSIS — F319 Bipolar disorder, unspecified: Principal | ICD-10-CM

## 2020-12-30 ENCOUNTER — Telehealth
Admit: 2020-12-30 | Discharge: 2020-12-31 | Payer: MEDICAID | Attending: Addiction (Substance Use Disorder) | Primary: Addiction (Substance Use Disorder)

## 2020-12-30 DIAGNOSIS — F4310 Post-traumatic stress disorder, unspecified: Principal | ICD-10-CM

## 2020-12-30 DIAGNOSIS — F411 Generalized anxiety disorder: Principal | ICD-10-CM

## 2021-01-04 MED FILL — HYDROXYCHLOROQUINE 200 MG TABLET: ORAL | 90 days supply | Qty: 180 | Fill #1

## 2021-01-04 MED FILL — AMLODIPINE 5 MG TABLET: ORAL | 90 days supply | Qty: 90 | Fill #2

## 2021-01-04 MED FILL — QUETIAPINE 50 MG TABLET: ORAL | 90 days supply | Qty: 180 | Fill #0

## 2021-01-04 MED FILL — CELECOXIB 200 MG CAPSULE: ORAL | 90 days supply | Qty: 90 | Fill #1

## 2021-01-04 MED FILL — TOPIRAMATE 25 MG TABLET: ORAL | 30 days supply | Qty: 120 | Fill #1

## 2021-01-04 MED FILL — STOOL SOFTENER-STIMULANT LAXATIVE 8.6 MG-50 MG TABLET: ORAL | 30 days supply | Qty: 60 | Fill #2

## 2021-01-05 ENCOUNTER — Telehealth
Admit: 2021-01-05 | Discharge: 2021-01-06 | Payer: MEDICAID | Attending: Addiction (Substance Use Disorder) | Primary: Addiction (Substance Use Disorder)

## 2021-01-05 DIAGNOSIS — F411 Generalized anxiety disorder: Principal | ICD-10-CM

## 2021-01-05 DIAGNOSIS — F319 Bipolar disorder, unspecified: Principal | ICD-10-CM

## 2021-01-05 DIAGNOSIS — F4310 Post-traumatic stress disorder, unspecified: Principal | ICD-10-CM

## 2021-01-13 ENCOUNTER — Telehealth
Admit: 2021-01-13 | Discharge: 2021-01-14 | Payer: MEDICAID | Attending: Addiction (Substance Use Disorder) | Primary: Addiction (Substance Use Disorder)

## 2021-01-13 DIAGNOSIS — F319 Bipolar disorder, unspecified: Principal | ICD-10-CM

## 2021-01-13 DIAGNOSIS — F411 Generalized anxiety disorder: Principal | ICD-10-CM

## 2021-01-13 DIAGNOSIS — F4310 Post-traumatic stress disorder, unspecified: Principal | ICD-10-CM

## 2021-01-18 ENCOUNTER — Telehealth: Payer: Self-pay | Admitting: Cardiology

## 2021-01-18 ENCOUNTER — Encounter: Payer: Self-pay | Admitting: Cardiology

## 2021-01-18 ENCOUNTER — Ambulatory Visit (INDEPENDENT_AMBULATORY_CARE_PROVIDER_SITE_OTHER): Payer: Medicaid Other | Admitting: Cardiology

## 2021-01-18 ENCOUNTER — Ambulatory Visit (INDEPENDENT_AMBULATORY_CARE_PROVIDER_SITE_OTHER): Payer: Medicaid Other

## 2021-01-18 ENCOUNTER — Other Ambulatory Visit: Payer: Self-pay

## 2021-01-18 VITALS — BP 136/108 | HR 101 | Ht 62.0 in | Wt 183.2 lb

## 2021-01-18 DIAGNOSIS — R002 Palpitations: Secondary | ICD-10-CM

## 2021-01-18 DIAGNOSIS — R079 Chest pain, unspecified: Secondary | ICD-10-CM

## 2021-01-18 DIAGNOSIS — R600 Localized edema: Secondary | ICD-10-CM | POA: Diagnosis not present

## 2021-01-18 DIAGNOSIS — I313 Pericardial effusion (noninflammatory): Secondary | ICD-10-CM | POA: Diagnosis not present

## 2021-01-18 DIAGNOSIS — I3139 Other pericardial effusion (noninflammatory): Secondary | ICD-10-CM

## 2021-01-18 MED ORDER — POTASSIUM CHLORIDE CRYS ER 20 MEQ PO TBCR: 20.0000 meq | EXTENDED_RELEASE_TABLET | Freq: Every day | ORAL | 3 refills | Status: DC

## 2021-01-18 MED ORDER — COLCHICINE 0.6 MG PO TABS
0.6000 mg | ORAL_TABLET | Freq: Two times a day (BID) | ORAL | 2 refills | Status: DC
Start: 2021-01-18 — End: 2021-02-15

## 2021-01-18 MED ORDER — FUROSEMIDE 40 MG PO TABS: 40.0000 mg | ORAL_TABLET | Freq: Every day | ORAL | 3 refills | Status: DC

## 2021-01-18 NOTE — Telephone Encounter (Signed)
Spoke to the patient just now. She let me know that her heart rate has been remaining between 120-150 beats per minute. Her blood pressure is 150/95. I advised that the patient needed to be evaluated now at the emergency room but she states that she went to the ED and was advised to contact a cardiologist. She requests to be seen by anyone who is able to see her today. Dr. Harriet Masson had an opening at 3pm and I got her scheduled for this time. The patient states that she will be there.    Encouraged patient to call back with any questions or concerns.

## 2021-01-18 NOTE — Telephone Encounter (Signed)
Tried calling patient. Mailbox is full, unable to leave a message.

## 2021-01-18 NOTE — Telephone Encounter (Signed)
Patient is calling with a question about her appt. Please give her a call back

## 2021-01-18 NOTE — Patient Instructions (Signed)
Medication Instructions:  Your physician has recommended you make the following change in your medication: START: Lasix 40 mg once daily START: Potassium 20 meq once daily START: Colchicine 0.6 mg twice daily  *If you need a refill on your cardiac medications before your next appointment, please call your pharmacy*   Lab Work: None If you have labs (blood work) drawn today and your tests are completely normal, you will receive your results only by: Marland Kitchen MyChart Message (if you have MyChart) OR . A paper copy in the mail If you have any lab test that is abnormal or we need to change your treatment, we will call you to review the results.   Testing/Procedures: Your physician has requested that you have an echocardiogram. Echocardiography is a painless test that uses sound waves to create images of your heart. It provides your doctor with information about the size and shape of your heart and how well your heart's chambers and valves are working. This procedure takes approximately one hour. There are no restrictions for this procedure.  A zio monitor was ordered today. It will remain on for 7 days. You will then return monitor and event diary in provided box. It takes 1-2 weeks for report to be downloaded and returned to [place]. We will call you with the results. If monitor falls off or has orange flashing light, please call Zio for further instructions.     Follow-Up: At [facility], you and your health needs are our priority.  As part of our continuing mission to provide you with exceptional heart care, we have created designated Provider Care Teams.  These Care Teams include your primary Cardiologist (physician) and Advanced Practice Providers (APPs -  Physician Assistants and Nurse Practitioners) who all work together to provide you with the care you need, when you need it.  We recommend signing up for the patient portal called "MyChart".  Sign up information is provided on this After Visit  Summary.  MyChart is used to connect with patients for Virtual Visits (Telemedicine).  Patients are able to view lab/test results, encounter notes, upcoming appointments, etc.  Non-urgent messages can be sent to your provider as well.   To learn more about what you can do with MyChart, go to [email or web address].    Your next appointment:   4 week(s)  The format for your next appointment:   In Person  Provider:   Berniece Salines, DO   Other Instructions
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
## 2021-01-19 ENCOUNTER — Ambulatory Visit: Admit: 2021-01-19 | Discharge: 2021-01-20 | Payer: MEDICAID

## 2021-01-19 ENCOUNTER — Telehealth: Payer: Self-pay

## 2021-01-19 DIAGNOSIS — Z79899 Other long term (current) drug therapy: Principal | ICD-10-CM

## 2021-01-19 DIAGNOSIS — M059 Rheumatoid arthritis with rheumatoid factor, unspecified: Principal | ICD-10-CM

## 2021-01-19 DIAGNOSIS — B182 Chronic viral hepatitis C: Principal | ICD-10-CM

## 2021-01-19 MED ORDER — HYDROXYCHLOROQUINE 200 MG TABLET
ORAL_TABLET | Freq: Two times a day (BID) | ORAL | 3 refills | 90.00000 days | Status: CP
Start: 2021-01-19 — End: 2022-01-19

## 2021-01-19 NOTE — Telephone Encounter (Signed)
Prior Auth for [place] completed and faxed to [facility] Track through [place] my meds. Confirmation number: [id number], status is still pending

## 2021-01-24 ENCOUNTER — Telehealth
Admit: 2021-01-24 | Discharge: 2021-01-25 | Payer: MEDICAID | Attending: Psychiatric/Mental Health | Primary: Psychiatric/Mental Health

## 2021-02-03 ENCOUNTER — Telehealth: Payer: Self-pay

## 2021-02-03 NOTE — Telephone Encounter (Signed)
Tried calling patient. No answer and no voicemail set up for me to leave a message. 

## 2021-02-03 NOTE — Telephone Encounter (Signed)
-----   Message from Richardo Priest, MD sent at 02/03/2021 12:34 PM EDT ----- I did not know this was ordered by Dr. Orlene Erm for my interpretation  Normal monitor

## 2021-02-06 ENCOUNTER — Telehealth: Payer: Self-pay | Admitting: Cardiology

## 2021-02-06 ENCOUNTER — Telehealth: Payer: Self-pay

## 2021-02-06 NOTE — Telephone Encounter (Signed)
Spoke with patient regarding results and recommendation.  Patient verbalizes understanding and is agreeable to plan of care. Advised patient to call back with any issues or concerns.  

## 2021-02-06 NOTE — Telephone Encounter (Signed)
Tried calling patient. No answer and no voicemail set up for me to leave a message. 

## 2021-02-06 NOTE — Telephone Encounter (Signed)
Follow up:     Patient returning a call back concering some results. Please leave a message threw mychart if voice full. Patient is at work.

## 2021-02-06 NOTE — Telephone Encounter (Signed)
-----   Message from Richardo Priest, MD sent at 02/03/2021 12:34 PM EDT ----- I did not know this was ordered by Dr. Orlene Erm for my interpretation  Normal monitor

## 2021-02-07 MED ORDER — CLONIDINE HCL 0.1 MG TABLET
ORAL_TABLET | Freq: Four times a day (QID) | ORAL | 0 refills | 90.00000 days
Start: 2021-02-07 — End: 2021-05-08

## 2021-02-10 ENCOUNTER — Other Ambulatory Visit: Payer: Self-pay

## 2021-02-10 ENCOUNTER — Ambulatory Visit (INDEPENDENT_AMBULATORY_CARE_PROVIDER_SITE_OTHER): Payer: Medicaid Other

## 2021-02-10 DIAGNOSIS — I3139 Other pericardial effusion (noninflammatory): Secondary | ICD-10-CM

## 2021-02-10 DIAGNOSIS — I313 Pericardial effusion (noninflammatory): Secondary | ICD-10-CM

## 2021-02-10 LAB — ECHOCARDIOGRAM COMPLETE
Area-P 1/2: 6.48 cm2
S' Lateral: 2.1 cm

## 2021-02-10 NOTE — Progress Notes (Signed)
Complete echocardiogram performed.  Jimmy Zonie Crutcher RDCS, RVT  

## 2021-02-15 ENCOUNTER — Other Ambulatory Visit: Payer: Self-pay

## 2021-02-15 ENCOUNTER — Ambulatory Visit (INDEPENDENT_AMBULATORY_CARE_PROVIDER_SITE_OTHER): Payer: Medicaid Other | Admitting: Cardiology

## 2021-02-15 ENCOUNTER — Encounter: Payer: Self-pay | Admitting: Cardiology

## 2021-02-15 VITALS — BP 110/76 | HR 80 | Ht 62.0 in | Wt 178.0 lb

## 2021-02-15 DIAGNOSIS — I301 Infective pericarditis: Secondary | ICD-10-CM

## 2021-02-15 DIAGNOSIS — E669 Obesity, unspecified: Secondary | ICD-10-CM | POA: Insufficient documentation

## 2021-02-15 DIAGNOSIS — R002 Palpitations: Secondary | ICD-10-CM

## 2021-02-15 DIAGNOSIS — I10 Essential (primary) hypertension: Secondary | ICD-10-CM

## 2021-02-15 HISTORY — DX: Essential (primary) hypertension: I10

## 2021-02-15 HISTORY — DX: Obesity, unspecified: E66.9

## 2021-02-15 HISTORY — DX: Infective pericarditis: I30.1

## 2021-02-15 HISTORY — DX: Palpitations: R00.2

## 2021-02-15 MED ORDER — PROPRANOLOL HCL 10 MG PO TABS: 10.0000 mg | ORAL_TABLET | Freq: Every day | ORAL | 3 refills | Status: DC

## 2021-02-15 NOTE — Patient Instructions (Signed)
Medication Instructions:  Your physician has recommended you make the following change in your medication: START: Propranolol 10 mg once daily *If you need a refill on your cardiac medications before your next appointment, please call your pharmacy*   Lab Work: None If you have labs (blood work) drawn today and your tests are completely normal, you will receive your results only by: Marland Kitchen MyChart Message (if you have MyChart) OR . A paper copy in the mail If you have any lab test that is abnormal or we need to change your treatment, we will call you to review the results.   Testing/Procedures: None   Follow-Up: At [facility], you and your health needs are our priority.  As part of our continuing mission to provide you with exceptional heart care, we have created designated Provider Care Teams.  These Care Teams include your primary Cardiologist (physician) and Advanced Practice Providers (APPs -  Physician Assistants and Nurse Practitioners) who all work together to provide you with the care you need, when you need it.  We recommend signing up for the patient portal called "MyChart".  Sign up information is provided on this After Visit Summary.  MyChart is used to connect with patients for Virtual Visits (Telemedicine).  Patients are able to view lab/test results, encounter notes, upcoming appointments, etc.  Non-urgent messages can be sent to your provider as well.   To learn more about what you can do with MyChart, go to [email or web address].    Your next appointment:   3 month(s)  The format for your next appointment:   In Person  Provider:   Berniece Salines, DO   Other Instructions
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
## 2021-02-21 ENCOUNTER — Telehealth
Admit: 2021-02-21 | Discharge: 2021-02-22 | Payer: MEDICAID | Attending: Psychiatric/Mental Health | Primary: Psychiatric/Mental Health

## 2021-02-21 DIAGNOSIS — F4310 Post-traumatic stress disorder, unspecified: Principal | ICD-10-CM

## 2021-02-21 DIAGNOSIS — F17200 Nicotine dependence, unspecified, uncomplicated: Principal | ICD-10-CM

## 2021-02-21 MED ORDER — LURASIDONE 120 MG TABLET
ORAL_TABLET | Freq: Every day | ORAL | 0 refills | 90 days | Status: CP
Start: 2021-02-21 — End: 2021-05-22

## 2021-02-21 MED ORDER — METFORMIN 500 MG TABLET
ORAL_TABLET | Freq: Two times a day (BID) | ORAL | 0 refills | 90 days | Status: CP
Start: 2021-02-21 — End: 2021-05-22
  Filled 2021-04-25: qty 180, 90d supply, fill #0

## 2021-02-21 MED ORDER — QUETIAPINE 50 MG TABLET
ORAL_TABLET | Freq: Every evening | ORAL | 0 refills | 90 days | Status: CP
Start: 2021-02-21 — End: 2021-05-22

## 2021-02-24 ENCOUNTER — Telehealth: Payer: Self-pay | Admitting: Cardiology

## 2021-02-24 NOTE — Telephone Encounter (Signed)
Pt c/o medication issue:  1. Name of Medication: chlor-something, patient is not sure of name  2. How are you currently taking this medication (dosage and times per day)?   3. Are you having a reaction (difficulty breathing--STAT)? no  4. What is your medication issue? Patient states she was told to try and get the medication, but has not been able to. She states medicaid won't cover it. She would like to know if there is something else she can take for the fluid around her heart.

## 2021-03-08 ENCOUNTER — Other Ambulatory Visit: Payer: Self-pay

## 2021-03-08 MED ORDER — COLCHICINE 0.6 MG PO CAPS: 0.6000 mg | ORAL_CAPSULE | Freq: Two times a day (BID) | ORAL | 3 refills | Status: DC

## 2021-03-08 NOTE — Telephone Encounter (Signed)
Spoke with patient about changing the medication per Dr. Terrial Rhodes orders. Patient verbalizes understanding. No further questions or concerns at this time. Prescription sent to pharmacy.

## 2021-03-08 NOTE — Progress Notes (Signed)
Prescription sent to pharmacy.

## 2021-03-13 DIAGNOSIS — M059 Rheumatoid arthritis with rheumatoid factor, unspecified: Principal | ICD-10-CM

## 2021-03-13 MED ORDER — HUMIRA PEN CITRATE FREE 40 MG/0.4 ML
SUBCUTANEOUS | 3 refills | 84.00000 days | Status: CP
Start: 2021-03-13 — End: ?
  Filled 2021-04-25: qty 6, 84d supply, fill #0

## 2021-03-15 ENCOUNTER — Telehealth
Admit: 2021-03-15 | Discharge: 2021-03-16 | Payer: MEDICAID | Attending: Psychiatric/Mental Health | Primary: Psychiatric/Mental Health

## 2021-03-15 DIAGNOSIS — F17200 Nicotine dependence, unspecified, uncomplicated: Principal | ICD-10-CM

## 2021-03-15 DIAGNOSIS — F4310 Post-traumatic stress disorder, unspecified: Principal | ICD-10-CM

## 2021-03-15 DIAGNOSIS — F411 Generalized anxiety disorder: Principal | ICD-10-CM

## 2021-03-15 NOTE — Unmapped (Signed)
[facility] Specialty Medication Onboarding    Specialty Medication: Humira pen 40mg /0.69ml  Prior Authorization: Approved   Financial Assistance: No - copay  <$25  Final Copay/Day Supply: $3 / 28 days    Insurance Restrictions: None     Notes to Pharmacist:     The triage team has completed the benefits investigation and has determined that the patient is able to fill this medication at [facility]. Please contact the patient to complete the onboarding or follow up with the prescribing physician as needed.
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
## 2021-03-21 ENCOUNTER — Ambulatory Visit: Payer: Medicaid Other | Admitting: Cardiology

## 2021-04-25 MED FILL — TOPIRAMATE 25 MG TABLET: ORAL | 30 days supply | Qty: 120 | Fill #2

## 2021-04-25 MED FILL — AMLODIPINE 5 MG TABLET: ORAL | 90 days supply | Qty: 90 | Fill #3

## 2021-04-25 MED FILL — CELECOXIB 200 MG CAPSULE: ORAL | 90 days supply | Qty: 90 | Fill #2

## 2021-04-25 MED FILL — HYDROXYCHLOROQUINE 200 MG TABLET: ORAL | 90 days supply | Qty: 180 | Fill #0

## 2021-04-26 ENCOUNTER — Telehealth
Admit: 2021-04-26 | Discharge: 2021-04-27 | Payer: MEDICAID | Attending: Psychiatric/Mental Health | Primary: Psychiatric/Mental Health

## 2021-04-26 MED ORDER — QUETIAPINE 50 MG TABLET
ORAL_TABLET | Freq: Every evening | ORAL | 0 refills | 90 days | Status: CP
Start: 2021-04-26 — End: 2021-07-25
  Filled 2021-06-08: qty 180, 90d supply, fill #0
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
## 2021-05-10 ENCOUNTER — Telehealth
Admit: 2021-05-10 | Discharge: 2021-05-11 | Payer: MEDICAID | Attending: Psychiatric/Mental Health | Primary: Psychiatric/Mental Health

## 2021-05-10 DIAGNOSIS — F4310 Post-traumatic stress disorder, unspecified: Principal | ICD-10-CM

## 2021-05-10 DIAGNOSIS — F17200 Nicotine dependence, unspecified, uncomplicated: Principal | ICD-10-CM
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
## 2021-05-25 ENCOUNTER — Ambulatory Visit: Admit: 2021-05-25 | Payer: MEDICAID

## 2021-05-25 NOTE — Unmapped (Signed)
Patient did not show for their rheumatology appointment today.

## 2021-06-01 ENCOUNTER — Ambulatory Visit: Payer: Medicaid Other | Admitting: Cardiology

## 2021-06-21 ENCOUNTER — Ambulatory Visit: Admit: 2021-06-21 | Payer: MEDICAID | Attending: Psychiatric/Mental Health | Primary: Psychiatric/Mental Health

## 2021-06-22 ENCOUNTER — Ambulatory Visit: Admit: 2021-06-22 | Payer: MEDICAID | Attending: Psychiatric/Mental Health | Primary: Psychiatric/Mental Health

## 2021-07-05 ENCOUNTER — Telehealth
Admit: 2021-07-05 | Discharge: 2021-07-06 | Payer: MEDICAID | Attending: Psychiatric/Mental Health | Primary: Psychiatric/Mental Health

## 2021-07-05 DIAGNOSIS — F4310 Post-traumatic stress disorder, unspecified: Principal | ICD-10-CM

## 2021-07-05 DIAGNOSIS — F17200 Nicotine dependence, unspecified, uncomplicated: Principal | ICD-10-CM

## 2021-07-05 MED ORDER — QUETIAPINE 200 MG TABLET
ORAL_TABLET | Freq: Every evening | ORAL | 1 refills | 90 days | Status: CP
Start: 2021-07-05 — End: 2022-01-01
  Filled 2021-07-17: qty 90, 90d supply, fill #0
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
## 2021-07-10 NOTE — Unmapped (Signed)
[facility] Clinical Assessment & Refill Coordination Note    Erica Jenkins, [place]: 21-Jul-1986  Phone: [phone] (home)     All above HIPAA information was verified with patient.     Was a Nurse, learning disability used for this call? No    Specialty Medication(s):   Inflammatory Disorders: Humira     Current Outpatient Medications   Medication Sig Dispense Refill   ??? amLODIPine (NORVASC) 5 MG tablet TAKE 1 TABLET BY MOUTH ONCE DAILY 90 tablet 3   ??? celecoxib (CELEBREX) 200 MG capsule Take 1 capsule (200 mg total) by mouth daily. 90 capsule 3   ??? HUMIRA PEN CITRATE FREE 40 MG/0.4 ML Inject the contents of 1 pen (40 mg total) under the skin every fourteen (14) days. 6 each 3   ??? hydrOXYchloroQUINE (PLAQUENIL) 200 mg tablet Take 1 tablet (200 mg total) by mouth two (2) times a day. 180 tablet 3   ??? medroxyPROGESTERone (PROVERA) 10 MG tablet Take 1 tablet by mouth once daily for 10 days. THEN take 1 tablet daily for 10 days as needed for no period in 42 days. 50 tablet 0   ??? metFORMIN (GLUCOPHAGE) 500 MG tablet Take 1 tablet (500 mg total) by mouth 2 (two) times a day with meals. 180 tablet 0   ??? QUEtiapine (SEROQUEL) 200 MG tablet Take 1 tablet (200 mg total) by mouth nightly. 90 tablet 1   ??? senna-docusate (STOOL SOFTENER-STIMULANT LAXAT) 8.6-50 mg Take 2 tablets by mouth daily as needed for constipation. 60 tablet 3   ??? topiramate (TOPAMAX) 25 MG tablet Take 2 tablets (50 mg total) by mouth Two (2) times a day  (in the morning and at bedtime). 120 tablet 2     No current facility-administered medications for this visit.        Changes to medications: Erica Jenkins reports no changes at this time.    Allergies   Allergen Reactions   ??? Hydroxyzine      suicidal idealization, anger, paranoia   ??? Cymbalta [Duloxetine]    ??? Etanercept      Other reaction(s): HIVES       Changes to allergies: No    SPECIALTY MEDICATION ADHERENCE         Medication Adherence    Patient reported X missed doses in the last month: 0  Specialty Medication: Humira 40mg  Q 14d  Patient is on additional specialty medications: No  Informant: patient  Adherence tools used: alarm          Specialty medication(s) dose(s) confirmed: Regimen is correct and unchanged.     Are there any concerns with adherence? No    Adherence counseling provided? Not needed    CLINICAL MANAGEMENT AND INTERVENTION      Clinical Benefit Assessment:    Do you feel the medicine is effective or helping your condition? Yes    Clinical Benefit counseling provided? Not needed    Adverse Effects Assessment:    Are you experiencing any side effects? No    Are you experiencing difficulty administering your medicine? No    Quality of Life Assessment:    Quality of Life    Rheumatology  Oncology  Dermatology  Cystic Fibrosis              Have you discussed this with your provider? Not needed    Acute Infection Status:    Acute infections noted within Epic:  No active infections  Patient reported infection: None  Therapy Appropriateness:    Is therapy appropriate? Yes, therapy is appropriate and should be continued    DISEASE/MEDICATION-SPECIFIC INFORMATION      For patients on injectable medications: Patient currently has 1 doses left.  Next injection is scheduled for 9/24.    PATIENT SPECIFIC NEEDS     - Does the patient have any physical, cognitive, or cultural barriers? No    - Is the patient high risk? No    - Does the patient require a Care Management Plan? No     - Does the patient require physician intervention or other additional services (i.e. nutrition, smoking cessation, social work)? No      SHIPPING     Specialty Medication(s) to be Shipped:   Inflammatory Disorders: Humira    Other medication(s) to be shipped: seroquel, denied all others     Changes to insurance: No    Delivery Scheduled: Yes, Expected medication delivery date: 9/27.     Medication will be delivered via UPS to the confirmed prescription address in [facility].    The patient will receive a drug information handout for each medication shipped and additional FDA Medication Guides as required.  Verified that patient has previously received a Conservation officer, historic buildings and a Surveyor, mining.    The patient or caregiver noted above participated in the development of this care plan and knows that they can request review of or adjustments to the care plan at any time.      All of the patient's questions and concerns have been addressed.    Julianne Rice   [facility] Specialty Pharmacist

## 2021-07-14 ENCOUNTER — Ambulatory Visit (INDEPENDENT_AMBULATORY_CARE_PROVIDER_SITE_OTHER): Payer: Medicaid Other | Admitting: Cardiology

## 2021-07-14 ENCOUNTER — Encounter: Payer: Self-pay | Admitting: Cardiology

## 2021-07-14 ENCOUNTER — Other Ambulatory Visit: Payer: Self-pay

## 2021-07-14 VITALS — BP 156/98 | HR 100 | Ht 61.0 in | Wt 189.8 lb

## 2021-07-14 DIAGNOSIS — F32A Depression, unspecified: Secondary | ICD-10-CM | POA: Insufficient documentation

## 2021-07-14 DIAGNOSIS — F419 Anxiety disorder, unspecified: Secondary | ICD-10-CM | POA: Diagnosis not present

## 2021-07-14 DIAGNOSIS — E669 Obesity, unspecified: Secondary | ICD-10-CM

## 2021-07-14 DIAGNOSIS — E782 Mixed hyperlipidemia: Secondary | ICD-10-CM | POA: Insufficient documentation

## 2021-07-14 DIAGNOSIS — I10 Essential (primary) hypertension: Secondary | ICD-10-CM | POA: Diagnosis not present

## 2021-07-14 DIAGNOSIS — F319 Bipolar disorder, unspecified: Secondary | ICD-10-CM | POA: Diagnosis not present

## 2021-07-14 MED ORDER — FUROSEMIDE 40 MG PO TABS: 40.0000 mg | ORAL_TABLET | ORAL | 2 refills | Status: DC

## 2021-07-14 MED ORDER — PROPRANOLOL HCL 20 MG PO TABS: 20.0000 mg | ORAL_TABLET | Freq: Two times a day (BID) | ORAL | 3 refills | Status: DC
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
## 2021-07-14 NOTE — Patient Instructions (Signed)
Medication Instructions:  Your physician has recommended you make the following change in your medication:  START: Propranolol 20 mg twice daily DECREASE: Lasix 40 mg once weekly *If you need a refill on your cardiac medications before your next appointment, please call your pharmacy*   Lab Work: None If you have labs (blood work) drawn today and your tests are completely normal, you will receive your results only by: [place] (if you have MyChart) OR A paper copy in the mail If you have any lab test that is abnormal or we need to change your treatment, we will call you to review the results.   Testing/Procedures: None   Follow-Up: At [facility], you and your health needs are our priority.  As part of our continuing mission to provide you with exceptional heart care, we have created designated Provider Care Teams.  These Care Teams include your primary Cardiologist (physician) and Advanced Practice Providers (APPs -  Physician Assistants and Nurse Practitioners) who all work together to provide you with the care you need, when you need it.  We recommend signing up for the patient portal called "MyChart".  Sign up information is provided on this After Visit Summary.  MyChart is used to connect with patients for Virtual Visits (Telemedicine).  Patients are able to view lab/test results, encounter notes, upcoming appointments, etc.  Non-urgent messages can be sent to your provider as well.   To learn more about what you can do with MyChart, go to [email or web address].    Your next appointment:   1 year(s)  The format for your next appointment:   In Person  Provider:   Berniece Salines, DO [address], [place], [place] [address]    Other Instructions

## 2021-07-17 MED FILL — HUMIRA PEN CITRATE FREE 40 MG/0.4 ML: SUBCUTANEOUS | 84 days supply | Qty: 6 | Fill #1

## 2021-08-02 ENCOUNTER — Telehealth
Admit: 2021-08-02 | Discharge: 2021-08-03 | Payer: MEDICAID | Attending: Geriatric Psychiatry | Primary: Geriatric Psychiatry

## 2021-08-02 DIAGNOSIS — F4310 Post-traumatic stress disorder, unspecified: Principal | ICD-10-CM

## 2021-08-02 DIAGNOSIS — F32A Depression, unspecified depression type: Principal | ICD-10-CM

## 2021-08-02 NOTE — Unmapped (Signed)
Follow-up instructions:  -- Please continue taking your medications as prescribed for your mental health.   -- Do not make changes to your medications, including taking more or less than prescribed, unless under the supervision of your physician. Be aware that some medications may make you feel worse if abruptly stopped  -- Please refrain from using illicit substances, as these can affect your mood and could cause anxiety or other concerning symptoms.   -- Seek further medical care for any increase in symptoms or new symptoms such as thoughts of wanting to hurt yourself or hurt others.     Contact info:  Life-threatening emergencies: call 911 or go to the nearest ER for medical or psychiatric attention.     Issues that need urgent attention but are not life threatening: call the clinic at [phone].    Non-urgent routine concerns, questions, and refill requests: please call the clinic for assistance.     Regarding appointments:  - If you need to cancel your appointment, we ask that you call [phone] at least 24 hours before your scheduled appointment.  - If for any reason you arrive 15 minutes later than your scheduled appointment time, you may not be seen and your visit may be rescheduled.  - Please remember that we will not automatically reschedule missed appointments.  - If you miss two (2) appointments without letting us know at least 24 hours in advance, you will likely be referred to a provider in your community.  - We will do our best to be on time. Sometimes an emergency will arise that might cause your clinician to be late. We will try to inform you of this when you check in for your appointment. If you wait more than 15 minutes past your appointment time without such notice, please speak with the front desk staff.    In the event of bad weather, the clinic staff will attempt to contact you, should your appointment need to be rescheduled. Additionally, you can call the Patient Weather Line [phone] for system-wide clinic status    For more information and reminders regarding clinic policies (these were provided when you were admitted to the clinic), please ask the front desk.    Andree Coss, MD  Department of Psychiatry  [facility]  [address]  [address]  [place], [place] [address]  Phone: [phone]
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
## 2021-09-04 ENCOUNTER — Telehealth
Admit: 2021-09-04 | Discharge: 2021-09-05 | Payer: MEDICAID | Attending: Geriatric Psychiatry | Primary: Geriatric Psychiatry

## 2021-09-04 DIAGNOSIS — G4700 Insomnia, unspecified: Principal | ICD-10-CM

## 2021-09-04 DIAGNOSIS — F32A Depression, unspecified depression type: Principal | ICD-10-CM

## 2021-09-04 MED ORDER — BUPROPION HCL XL 150 MG 24 HR TABLET, EXTENDED RELEASE
ORAL_TABLET | Freq: Every day | ORAL | 3 refills | 90.00000 days | Status: CP
Start: 2021-09-04 — End: 2022-09-04
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
## 2021-09-05 DIAGNOSIS — I10 Essential (primary) hypertension: Principal | ICD-10-CM

## 2021-09-05 MED ORDER — AMLODIPINE 5 MG TABLET
ORAL_TABLET | Freq: Every day | ORAL | 3 refills | 90 days
Start: 2021-09-05 — End: 2022-09-05

## 2021-09-28 ENCOUNTER — Telehealth
Admit: 2021-09-28 | Discharge: 2021-09-29 | Payer: MEDICAID | Attending: Geriatric Psychiatry | Primary: Geriatric Psychiatry

## 2021-09-28 DIAGNOSIS — F4310 Post-traumatic stress disorder, unspecified: Principal | ICD-10-CM
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
## 2021-10-04 NOTE — Unmapped (Signed)
The [facility] has made a second and final attempt to reach this patient to refill the following medication:Humira.      We have been unable to leave messages on the following phone numbers: [phone]; [phone] and have sent a MyChart message.    Dates contacted: 12/5, 12/14  Last scheduled delivery: 9/26    The patient may be at risk of non-compliance with this medication. The patient should call the [facility] at [phone]  Option 4, then Option 2 (all other specialty patients) to refill medication.    Olga Millers   [facility] Specialty Technician

## 2021-10-11 MED FILL — QUETIAPINE 200 MG TABLET: ORAL | 90 days supply | Qty: 90 | Fill #1

## 2021-10-25 ENCOUNTER — Telehealth: Payer: Self-pay | Admitting: Cardiology

## 2021-10-25 NOTE — Unmapped (Signed)
[facility] RHEUMATOLOGY [facility] - PHARMACIST NOTES    The [facility] has not been able to reach patient for refill of Humira.  Last shipment went out on 07/17/21 for a 3 month supply.  Attempted to check in patient today.  Unable to get in touch with patient, left voicemail requesting call back to [facility] at [phone], option 4.     Chelsea Aus

## 2021-10-25 NOTE — Telephone Encounter (Signed)
Spoke to patient she stated she has been having left arm pain for the past 3 days and a bad headache.No chest pain. B/P has been elevated 152/115,131/99,136/101.Pulse 102 to 115.See saw PCP this morning and he increased Amlodipine to 10 mg daily.She requested to see Dr.Tobb about left arm pain.Appointment scheduled with Dr.Tobb 10/26/21 at 9:20 am.Advised if left arm pain worsens to go to ED.

## 2021-10-25 NOTE — Telephone Encounter (Signed)
Pt c/o BP issue: STAT if pt c/o blurred vision, one-sided weakness or slurred speech  1. What are your last 5 BP readings?  136/101 PULSE 107   2. Are you having any other symptoms (ex. Dizziness, headache, blurred vision, passed out)? HEADACHE FOR 3 DAYS  3. What is your BP issue? PT HAS BEEN EXPERIENCING HIGH BP , LEFT ARM PAIN AND HEADACHE FOR MORE THAN 3 DAYS AND BACK PAIN BUT THAT IS NORMAL PER PT. PT HAS NOT BEEN EATING FOR THE PAST MONTH SO SHE HAVE LOST 20 LBS  PT WENT TO SEE HER PCP TODAY, HE INCREASED BP MEDS, PT SAID HER PCP ASKED HER IF THE ARM WAS DUE TO ARTHRITIS

## 2021-10-26 ENCOUNTER — Ambulatory Visit: Payer: Medicaid Other | Admitting: Cardiology

## 2021-10-26 ENCOUNTER — Telehealth
Admit: 2021-10-26 | Discharge: 2021-10-27 | Payer: PRIVATE HEALTH INSURANCE | Attending: Geriatric Psychiatry | Primary: Geriatric Psychiatry

## 2021-10-26 DIAGNOSIS — F4310 Post-traumatic stress disorder, unspecified: Principal | ICD-10-CM

## 2021-10-26 DIAGNOSIS — G4700 Insomnia, unspecified: Principal | ICD-10-CM

## 2021-10-26 DIAGNOSIS — F32A Depression, unspecified depression type: Principal | ICD-10-CM

## 2021-10-26 MED ORDER — BUPROPION HCL XL 150 MG 24 HR TABLET, EXTENDED RELEASE
ORAL_TABLET | Freq: Every day | ORAL | 3 refills | 90 days | Status: CP
Start: 2021-10-26 — End: 2022-10-26
  Filled 2021-11-13: qty 90, 90d supply, fill #0
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
## 2021-11-07 ENCOUNTER — Ambulatory Visit: Admit: 2021-11-07 | Payer: PRIVATE HEALTH INSURANCE

## 2021-11-07 NOTE — Unmapped (Signed)
Patient did not attend clinic for scheduled rheumatology appointment today.

## 2021-11-07 NOTE — Unmapped (Unsigned)
Last clinic visit: March 2022.  No show for appt with Carlus Pavlov PA-c in Aug 2022.      Accompanied by: *** her 38 year old daughter, TEFL teacher Complaint: follow up seropositive (+RF) erosive Rheumatoid Arthritis (RA)    History of Present Illness:     HPI:  Erica Jenkins is a 36 y.o. female with a history of Hepatitis C who presents for follow up of seropositive (+RF) erosive Rheumatoid Arthritis (RA). First seen in Jan 2019 at which time she had not yet been treated for Hep C, RF was 95, CCP was neg, XR of her feet showed possible changes in the L great and small toes, and XR of her hands showed erosion in her R 5th finger. Started on plaquenil Jan 2019.  Started humira May 2019.  (GI had asked her not to start sulfasalazine until after completed mavyret for Hep C.)      Treatment history:  - Previously on prednisone, mtx, and enbrel by a rheumatologist in [facility].??Stopped enbrel due to hives.??  -??Established care with [facility] rheumatology in 2013, at which time she did not have evidence for active synovitis by exam or ultrasound despite being on no DMARD tx.??  -??Lost to f/u until 2019, when she re-established care with [place] rheumatology. At that time had evidence for active RA. HCQ started 10/2017.??  -??Humira added 01/2018.????Continued HCQ.   - MTX recommended in 12/2018 due to concern for persistent disease activity, but pt did not start due to fear of hair loss with use.   - Celebrex added for continued pain 07/2019.  - Continues humira q 14 days and HCQ, celebrex.     Interval Events:  - [facility] unable to reach pt to refill Humira.     Current Treatment:  - Humira 40 mg q14 days  - Plaquenil 200 mg daily  - Celebrex 200 mg daily    Interval History:  - ***       - The patient reports that she is overall well with intermittent pain that she describes as a 1/10  - She notes that the pain was most recently in her elbow but has since resolved  - Denies joint pain or swelling, fever, chills, injection site reactions, abdominal pain  - She notes that she usually has pain predominantly in her bilateral elbows, shoulders, and hips  - She describes that it has been a couple years since her last Plaquenil eye exam.  - The patient was recently diagnosed with pericarditis and is wearing a heart monitor. For the past 3 weeks her HR has been averaging ~150 and is being followed by cardiology  - She reports a recent rash due to eczema  - She currently is on antibiotics due to a recent ear infection      Record review:  I have reviewed the patient's allergies, medications, pertinent past medical, surgical, social and family history and have updated in Epic where appropriate. I have also reviewed the pertinent records including notes, labs, and imaging tests in the medical record and Care Everywhere.       Objective    Physical Exam:  There were no vitals filed for this visit.  There is no height or weight on file to calculate BMI.  GENERAL: The patient is well appearing, in no acute distress. Ambulates around exam room easily and climbs on exam table without difficulty.  SKIN:    No rashes, psoriasis, tophi, or subcutaneous  nodules.  EYES: EOMI, PERRL. Sclera anicteric, conjunctiva non- injected.   ENT: mucus membranes moist without pharyngeal exudates or ulcers.   Neck: supple, no cervical lymphadenopathy, no thyromegaly  Respiratory: Breathing non-labored, CTA bilaterally, no wheezing, crackles, or rhonchi  CV: Heart rate regular, no murmurs  GI: Abdomen soft, nontender, nondistended, no hepatosplenomegaly  VASCULAR: warm and well perfused extremities, no c/c/e.  NEURO: CN 2-12 grossly intact.   PSYCH: No depression or anxiety. Cooperative. Alert and oriented.   MUSCULOSKELETAL:   ?? Bilateral shoulders, elbows, wrists, hands, fingers:  No deformity, erythema, warmth, swelling, effusion, tenderness, or limited ROM.?? Grip strength good. Able to curl all fingers. Prayer sign negative.  Except: R wrist mild discomfort with flex/ext and mildly decreased ext. R elbow discomfort with extension, mild flexion contracture.   ?? Spine diffusely TTP midline and paraspinal region.   ?? Bilateral knees, ankles, feet, toes: No deformity, erythema, warmth, swelling, effusion, tenderness, or limited ROM.  Except: Bilat knee TTP. Bilat MTP squeeze painful.      Test Results:  10/24/2017 XR hands: Interval progression of right proximal carpal row joint space collapse. Small cortical erosion, base of the right fifth proximal phalanx. ??Multifocal soft tissue swelling about the MCP and PIP joints, right greater than left hands. Findings appear most consistent with provided history of rheumatoid arthropathy.     10/24/2017 XR feet: Small nonspecific cortical lucencies within the left great toe and fifth metatarsal head suggestive of erosive arthropathy.    10/24/2017  CCP 8 (equivocal)  ESR 15, CRP <5.0  Quant gold neg.   Hep B SAg: Negative  Hep B CAb: Neg    08/23/2017  RF 95    12/11/2018:  HCV Not detected  Reviewed CBC and CMP      Assessment/Plan:     Erica Jenkins is a 36 y.o. female with a history of hepatitis C and polysubstance abuse first seen in Jan 2019 for joint pain, positive RF, noted to have significant synovitis on exam. CCP equivocal. Started on plaquenil (and sulfasalazine but did not start SSZ per GI recs), further RA treatment deferred until OK with GI with some improvement just on plaquenil. Humira added May 2019.     Seropositive Erosive Rheumatoid Arthritis (RA): Discussed with pt that I think her RA is much improved, however still has some mild activity with AM stiffness and requiring Celebrex. I think it is unlikely her back pain is due to RA.   - Continue humira every 2 weeks.  - Continue plaquenil 200 mg BID.  - GI previously indicated that it is reasonable to start methotrexate now that she has completed Hep C treatment. Discussed that I feel she would benefit from adding MTX to improve RA control and to prevent antibodies which would make humira less effective as well as due to ongoing disease activity.   - Will repeat XR hands and feet to evaluate for progression as last checked in 2019, ordered again today as did not complete last year.       Hepatitis C: Now completed treatment with Mavyret. Repeat HCV DNA not detected. Seen 12/11/2018 by Owens Shark FNP in GI. Recommended follow-up as needed.   - Previously discussed with hepatology that would be OK to start methotrexate in the future if we feel she would benefit from it.    Contraception Counseling:  S/p tubal ligation, no plans for future pregnancy.   - Reviewed methotrexate teratogenic, need to stop for 3-6 months prior to trying to  conceive if becomes interested in pregnancy in the future.     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   - Not discussed today.     High risk medication monitoring:   - Patient is currently taking Humira, immunosuppressant medications that requires intensive monitoring. This monitoring was done today through history, physical, and/or lab testing.   - No recent infections or hospitalizations.   - Patient had a quant gold in Jan 2019 that was neg.  she does not have any risk factors for TB exposure - no travel to TB-endemic area, does not volunteer, work, or live at [employer] or correctional facility, does not care for TB patients, no contact with individual with active pulmonary TB. Does not require repeat quant gold unless she has new risk factors for TB exposure. TB risk factors were reviewed: 11/06/2021       Long-term plaquenil use: Patient is currently taking plaquenil which requires intensive monitoring including regular eye exams due to risk for retinal toxicity.    - Plaquenil started: Jan 2019  - Last eye exam was: Jan 2019     - Patient has the following risk factors for retinal toxicity from plaquenil: None   - Reminded patient to call to schedule next eye exam as she was due for repeat eye exam in Jan.  She prefers to see eye doctor locally - lives in [place].      Tobacco abuse: Patient currently using cigarettes and vaping.    - Encouraged tobacco cessation.   - Reviewed that smoking can make arthritis more severe and more difficult to treat, in addition to increasing risk for heart disease, cancer, etc.   - 3-10 minutes of tobacco cessation counseling provided including discussing prior quit attempts, assessing motivation and interest in quitting, and encouraging cessation.     Immunization counseling:  Flu vaccine: ***  Prevnar PCV-03 Nov 2017.    Pneumovax PPSV-13 April 2018.     Covid-19 vaccine:   Dose 1 09/03/2020  Dose 2 09/24/2020  Bivalent booster ***          Follow-up: No follow-ups on file.    Danella Maiers, MD, MSCI  Assistant Professor of Medicine  Department of Medicine/Division of Rheumatology  [facility]  [phone] clinic phone  [phone] clinic secure fax    I personally spent *** minutes face-to-face and non-face-to-face in the care of this patient, which includes all pre, intra, and post visit time on the date of service.       There are no diagnoses linked to this encounter.

## 2021-11-10 NOTE — Unmapped (Signed)
Patient called in and declined Humira refill. Patient states she has 3 boxes on hand due to forgetting to take doses.

## 2021-11-30 ENCOUNTER — Ambulatory Visit
Admit: 2021-11-30 | Payer: PRIVATE HEALTH INSURANCE | Attending: Psychiatric/Mental Health | Primary: Psychiatric/Mental Health

## 2021-12-21 DIAGNOSIS — M059 Rheumatoid arthritis with rheumatoid factor, unspecified: Principal | ICD-10-CM

## 2021-12-21 MED ORDER — QUETIAPINE 200 MG TABLET
ORAL_TABLET | Freq: Every evening | ORAL | 1 refills | 90.00000 days | Status: CN
Start: 2021-12-21 — End: 2022-06-19

## 2021-12-21 NOTE — Unmapped (Signed)
[facility] Clinical Assessment & Refill Coordination Note    Erica Jenkins, [place]: 11/06/1985  Phone: [phone] (home)     All above HIPAA information was verified with patient.     Was a Nurse, learning disability used for this call? No    Specialty Medication(s):   Inflammatory Disorders: Humira     Current Outpatient Medications   Medication Sig Dispense Refill   ??? amLODIPine (NORVASC) 5 MG tablet TAKE 1 TABLET BY MOUTH ONCE DAILY 90 tablet 3   ??? buPROPion (WELLBUTRIN XL) 150 MG 24 hr tablet Take 1 tablet (150 mg total) by mouth daily. 90 tablet 3   ??? HUMIRA PEN CITRATE FREE 40 MG/0.4 ML Inject the contents of 1 pen (40 mg total) under the skin every fourteen (14) days. 6 each 3   ??? hydrOXYchloroQUINE (PLAQUENIL) 200 mg tablet Take 1 tablet (200 mg total) by mouth two (2) times a day. 180 tablet 3   ??? medroxyPROGESTERone (PROVERA) 10 MG tablet Take 1 tablet by mouth once daily for 10 days. THEN take 1 tablet daily for 10 days as needed for no period in 42 days. 50 tablet 0   ??? metFORMIN (GLUCOPHAGE) 500 MG tablet Take 1 tablet (500 mg total) by mouth 2 (two) times a day with meals. 180 tablet 0   ??? QUEtiapine (SEROQUEL) 200 MG tablet Take 1 tablet (200 mg total) by mouth nightly. 90 tablet 1   ??? topiramate (TOPAMAX) 25 MG tablet Take 2 tablets (50 mg total) by mouth Two (2) times a day  (in the morning and at bedtime). 120 tablet 2     No current facility-administered medications for this visit.        Changes to medications: Patient states that she's currently not taking Metformin and Topiramate because she feels they do not help.  Patient has not been taking Bupropion because she does not like how it makes her feel.  She also has not been taking Hydroxychloroquine.    Allergies   Allergen Reactions   ??? Hydroxyzine      suicidal idealization, anger, paranoia   ??? Cymbalta [Duloxetine]    ??? Etanercept      Other reaction(s): HIVES       Changes to allergies: No    SPECIALTY MEDICATION ADHERENCE     Humira 40 mg/0.4 ml: 28 days of medicine on hand (2 pens remaining)     Medication Adherence    Patient reported X missed doses in the last month: 0  Specialty Medication: Humira 40 mg/0.4 ml  Informant: patient  Adherence tools used: alarm          Specialty medication(s) dose(s) confirmed: Regimen is correct and unchanged.     Are there any concerns with adherence? Yes: patient has not been taking her hydroxychloroquine    Adherence counseling provided? Yes: counseled patient on the importance of taking her Humira and hydroxychloroquine to control her RA     CLINICAL MANAGEMENT AND INTERVENTION      Clinical Benefit Assessment:    Do you feel the medicine is effective or helping your condition? Yes    Clinical Benefit counseling provided? Not needed    Adverse Effects Assessment:    Are you experiencing any side effects? No    Are you experiencing difficulty administering your medicine? No    Quality of Life Assessment:    Quality of Life    Rheumatology  1. What impact has your specialty medication had on the  reduction of your daily pain level?: Some  2. What impact has your specialty medication had on your ability to complete daily tasks (prepare meals, get dressed, etc...)?: Some  Oncology  Dermatology  Cystic Fibrosis          How many days over the past month did your RA  keep you from your normal activities? For example, brushing your teeth or getting up in the morning. shoulder and knee pain every other day especially in the morning and evening; stiffiness in neck and fingers daily    Have you discussed this with your provider? Patient will reach out to provider.  She would prefer oral medications instead of Humira.    Acute Infection Status:    Acute infections noted within Epic:  No active infections  Patient reported infection: Patient thinks she may have a UTI, plans to see a doctor today.    Therapy Appropriateness:    Is therapy appropriate and patient progressing towards therapeutic goals? Yes, therapy is appropriate and should be continued    DISEASE/MEDICATION-SPECIFIC INFORMATION      For patients on injectable medications: Patient currently has 2 doses left.  Next injection is scheduled for 3/16.    PATIENT SPECIFIC NEEDS     - Does the patient have any physical, cognitive, or cultural barriers? No    - Is the patient high risk? No    - Does the patient require a Care Management Plan? No     SOCIAL DETERMINANTS OF HEALTH     At the [facility], we have learned that life circumstances - like trouble affording food, housing, utilities, or transportation can affect the health of many of our patients.   That is why we wanted to ask: are you currently experiencing any life circumstances that are negatively impacting your health and/or quality of life? No    Social Determinants of Health     Food Insecurity: Not on file   Tobacco Use: Not on file   Transportation Needs: Not on file   Alcohol Use: Not on file   Housing/Utilities: Unknown   ??? Within the past 12 months, have you ever stayed: outside, in a car, in a tent, in an overnight shelter, or temporarily in someone else's home (i.e. couch-surfing)?: No   ??? Are you worried about losing your housing?: Not on file   ??? Within the past 12 months, have you been unable to get utilities (heat, electricity) when it was really needed?: Not on file   Substance Use: Not on file   Financial Resource Strain: Not on file   Physical Activity: Not on file   Health Literacy: Low Risk    ??? : Never   Stress: Not on file   Intimate Partner Violence: Not on file   Depression: Not on file   Social Connections: Not on file       Would you be willing to receive help with any of the needs that you have identified today? Not applicable       SHIPPING     Specialty Medication(s) to be Shipped:   Inflammatory Disorders: Humira    Other medication(s) to be shipped: Seroquel, Hydroxychloroquine.  Denied all other meds.     Changes to insurance: No    Delivery Scheduled: Yes, Expected medication delivery date: 3/10.     Medication will be delivered via UPS to the confirmed prescription address in [facility].    The patient will receive a drug information handout for  each medication shipped and additional FDA Medication Guides as required.  Verified that patient has previously received a Conservation officer, historic buildings and a Surveyor, mining.    The patient or caregiver noted above participated in the development of this care plan and knows that they can request review of or adjustments to the care plan at any time.      All of the patient's questions and concerns have been addressed.    Thelbert Gartin Glade Nurse  [facility] Specialty Pharmacist

## 2021-12-21 NOTE — Unmapped (Signed)
Pharmacy contacted the clinic on behalf of the patient. Pharmacy verified. Erica Jenkins

## 2021-12-26 ENCOUNTER — Telehealth
Admit: 2021-12-26 | Discharge: 2021-12-27 | Payer: PRIVATE HEALTH INSURANCE | Attending: Psychiatric/Mental Health | Primary: Psychiatric/Mental Health
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
## 2021-12-28 MED FILL — HYDROXYCHLOROQUINE 200 MG TABLET: ORAL | 90 days supply | Qty: 180 | Fill #1

## 2021-12-28 MED FILL — HUMIRA PEN CITRATE FREE 40 MG/0.4 ML: SUBCUTANEOUS | 84 days supply | Qty: 6 | Fill #2

## 2021-12-28 MED FILL — QUETIAPINE 200 MG TABLET: ORAL | 30 days supply | Qty: 30 | Fill #0

## 2022-02-05 MED ORDER — QUETIAPINE 200 MG TABLET
ORAL_TABLET | Freq: Every evening | ORAL | 0 refills | 30 days | Status: CP
Start: 2022-02-05 — End: 2022-03-07
  Filled 2022-02-09: qty 30, 30d supply, fill #0

## 2022-02-05 NOTE — Unmapped (Signed)
Pharmacy contacted the clinic on behalf of the patient. Pharmacy verified. Erica Jenkins

## 2022-02-07 DIAGNOSIS — F3132 Bipolar disorder, current episode depressed, moderate: Principal | ICD-10-CM

## 2022-05-28 NOTE — Unmapped (Signed)
The [facility] has made a third and final attempt to reach this patient to refill the following medication:Humira.      We have left voicemails on the following phone numbers: [phone] and have sent a text message to the following phone numbers: [phone].    Dates contacted: 5/19, 5/25, 8/7  Last scheduled delivery: 3/9    The patient may be at risk of non-compliance with this medication. The patient should call the [facility] at [phone]  Option 4, then Option 2 (all other specialty patients) to refill medication.    Olga Millers   [facility] Specialty Technician

## 2022-05-31 NOTE — Unmapped (Signed)
Sabino Gasser,     Dr. Sullivan Lone is currently out of the office, but I will pass along your question.     Best,   Florentina Addison

## 2022-05-31 NOTE — Unmapped (Signed)
[facility] - PHARMACIST NOTES    The [facility] has not been able to reach patient for refill of Humira.  Last shipment went out on 12/28/21 for a 3 month supply.  Attempted to check in patient today.  Unable to get in touch with patient, left voicemail requesting call back to [facility] at [phone], option 4.     Rip Harbour,  PharmD Candidate

## 2022-06-14 NOTE — Unmapped (Signed)
Specialty Medication(s): Humira    Ms.Gleich has been dis-enrolled from the [facility] specialty pharmacy services due to multiple unsuccessful outreach attempts by the pharmacy.    Additional information provided to the patient: last fill 12/28/21 for 84 days    Erica Jenkins  [facility] Specialty Pharmacist

## 2022-06-21 ENCOUNTER — Other Ambulatory Visit: Payer: Self-pay | Admitting: Cardiology

## 2022-06-22 ENCOUNTER — Other Ambulatory Visit: Payer: Self-pay

## 2022-06-22 MED ORDER — PROPRANOLOL HCL 20 MG PO TABS: 20.0000 mg | ORAL_TABLET | Freq: Two times a day (BID) | ORAL | 0 refills | Status: DC

## 2022-06-29 DIAGNOSIS — M059 Rheumatoid arthritis with rheumatoid factor, unspecified: Principal | ICD-10-CM

## 2022-06-29 MED ORDER — HUMIRA PEN CITRATE FREE 40 MG/0.4 ML
SUBCUTANEOUS | 3 refills | 84 days
Start: 2022-06-29 — End: ?

## 2022-06-29 NOTE — Unmapped (Signed)
Patient needs to call and schedule an appointment before any refills

## 2022-06-29 NOTE — Unmapped (Signed)
[facility] Clinical Assessment & Refill Coordination Note    Erica Jenkins, [place]: Jan 23, 1986  Phone: [phone] (home)     All above HIPAA information was verified with patient.     Was a Nurse, learning disability used for this call? No    Specialty Medication(s):   Inflammatory Disorders: Humira     Current Outpatient Medications   Medication Sig Dispense Refill    ALPRAZolam (XANAX) 0.25 MG tablet Take 1 tablet (0.25 mg total) by mouth four (4) times a day as needed for anxiety.      amLODIPine (NORVASC) 2.5 MG tablet Take 1 tablet (2.5 mg total) by mouth daily.      atenoloL (TENORMIN) 100 MG tablet Take 1 tablet (100 mg total) by mouth daily.      atorvastatin (LIPITOR) 80 MG tablet Take 1 tablet (80 mg total) by mouth daily.      escitalopram oxalate (LEXAPRO) 20 MG tablet Take 1 tablet (20 mg total) by mouth daily.      HUMIRA PEN CITRATE FREE 40 MG/0.4 ML Inject the contents of 1 pen (40 mg total) under the skin every fourteen (14) days. 6 each 3    QUEtiapine (SEROQUEL XR) 300 MG 24 hr tablet Take 1 tablet (300 mg total) by mouth nightly.       No current facility-administered medications for this visit.        Changes to medications:  [name] reports stopping:amlodipine 5mg , bupropion 150mg , medroxyprogesterone 10mg , metformin 500mg , quetiapine 200mg , topiramate 25mg  & started: amlodipine 2.5mg , atenolol 100mg , alprazolam 0.25mg , escitalopram 20mg , atorvastatin 80mg  and quetiapine xr 300mg     Allergies   Allergen Reactions    Hydroxyzine      suicidal idealization, anger, paranoia    Cymbalta [Duloxetine]     Etanercept      Other reaction(s): HIVES       Changes to allergies: No    SPECIALTY MEDICATION ADHERENCE     Humira 40  mg/0.63mL : 0 days of medicine on hand       Medication Adherence    Patient reported X missed doses in the last month: 0  Specialty Medication: Humira 40mg /0.77mL  Patient is on additional specialty medications: No  Informant: patient      Adherence tools used: alarm                      Specialty medication(s) dose(s) confirmed: Regimen is correct and unchanged.     Are there any concerns with adherence? Yes: pt was disenrolled due to being unable to reach. Pt states she was using up what she had on hand as she was receiving shipments directly from manufacture. Also, updated phone number on file     Adherence counseling provided? Yes: counseled on the importance of regular use to keep RA well controlled    CLINICAL MANAGEMENT AND INTERVENTION      Clinical Benefit Assessment:    Do you feel the medicine is effective or helping your condition? Yes    Clinical Benefit counseling provided? Not needed    Adverse Effects Assessment:    Are you experiencing any side effects? No    Are you experiencing difficulty administering your medicine? No    Quality of Life Assessment:    Quality of Life    Rheumatology  Oncology  Dermatology  Cystic Fibrosis          How many days over the past month did your RA  keep you  from your normal activities? For example, brushing your teeth or getting up in the morning. 0    Have you discussed this with your provider? Not needed    Acute Infection Status:    Acute infections noted within Epic:  No active infections  Patient reported infection: None    Therapy Appropriateness:    Is therapy appropriate and patient progressing towards therapeutic goals? Yes, therapy is appropriate and should be continued    DISEASE/MEDICATION-SPECIFIC INFORMATION      For patients on injectable medications: Patient currently has 0 doses left.  Next injection is scheduled for 07/06/22.    PATIENT SPECIFIC NEEDS     Does the patient have any physical, cognitive, or cultural barriers? No    Is the patient high risk? No    Does the patient require a Care Management Plan? No     SOCIAL DETERMINANTS OF HEALTH     At the [facility], we have learned that life circumstances - like trouble affording food, housing, utilities, or transportation can affect the health of many of our patients.   That is why we wanted to ask: are you currently experiencing any life circumstances that are negatively impacting your health and/or quality of life? Patient declined to answer    Social Determinants of Health     Financial Resource Strain: Not on file   Internet Connectivity: Not on file   Food Insecurity: Not on file   Tobacco Use: High Risk (05/13/2020)    Patient History     Smoking Tobacco Use: Every Day     Smokeless Tobacco Use: Never     Passive Exposure: Not on file   Housing/Utilities: Unknown (01/28/2021)    Housing/Utilities     Within the past 12 months, have you ever stayed: outside, in a car, in a tent, in an overnight shelter, or temporarily in someone else's home (i.e. couch-surfing)?: No     Are you worried about losing your housing?: Not on file     Within the past 12 months, have you been unable to get utilities (heat, electricity) when it was really needed?: Not on file   Alcohol Use: Not on file   Transportation Needs: Not on file   Substance Use: Not on file   Health Literacy: Low Risk  (01/28/2021)    Health Literacy     : Never   Physical Activity: Not on file   Interpersonal Safety: Not on file   Stress: Not on file   Intimate Partner Violence: Not on file   Depression: Not at risk (01/25/2018)    PHQ-2     PHQ-2 Score: 0   Social Connections: Not on file       Would you be willing to receive help with any of the needs that you have identified today? Not applicable       SHIPPING     Specialty Medication(s) to be Shipped:   Inflammatory Disorders: Humira    Other medication(s) to be shipped: No additional medications requested for fill at this time     Changes to insurance: No    Delivery Scheduled: Yes, Expected medication delivery date: 07/04/22.  However, Rx request for refills was sent to the provider as there are none remaining.     Medication will be delivered via UPS to the confirmed prescription address in [facility].    The patient will receive a drug information handout for each medication shipped and additional FDA Medication Guides as required.  Verified that patient has previously received a Conservation officer, historic buildings and a Surveyor, mining.    The patient or caregiver noted above participated in the development of this care plan and knows that they can request review of or adjustments to the care plan at any time.      All of the patient's questions and concerns have been addressed.    Teofilo Pod   [facility] Specialty Pharmacist

## 2022-07-02 NOTE — Unmapped (Signed)
[facility]rics[facility] Heav[facility] 's Humira shipment will be canceled  as a result of no refills remain on the prescription.      I have reached out to the patient  [phone] and communicated the delivery change. We will not reschedule the medication and have removed this/these medication(s) from the work request.  We have canceled this work request.   Patient aware she needs to contact clinic for refills.     Baker Janus  [facility]   [phone] opt 4

## 2022-09-10 ENCOUNTER — Other Ambulatory Visit: Payer: Self-pay | Admitting: Cardiology

## 2023-01-11 ENCOUNTER — Other Ambulatory Visit: Payer: Self-pay | Admitting: Cardiology

## 2023-01-11 ENCOUNTER — Telehealth: Payer: Self-pay | Admitting: Cardiology

## 2023-01-11 NOTE — Telephone Encounter (Signed)
Spoke with pharmacy staff. They state they received a refill for Propranolol for 15 a day supply for this pt. After looking over pt's chart she has not been seen since 2022 and will need to be seen in office. Propranolol rx disregarded.

## 2023-01-11 NOTE — Telephone Encounter (Signed)
Patient's pharmacy called and mentioned that Jeanann Lewandowsky sent in a prescription for propranolol (INDERAL) 20 MG tablet while her PCP sent in a prescription for her to take atenolol. Wants to know if Dr. Harriet Masson know about the patient having to beta blockers.

## 2023-07-03 ENCOUNTER — Ambulatory Visit: Payer: Medicaid Other | Admitting: Cardiology

## 2023-07-09 ENCOUNTER — Ambulatory Visit: Payer: Medicaid Other

## 2023-07-10 ENCOUNTER — Ambulatory Visit: Payer: Medicaid Other

## 2023-07-11 ENCOUNTER — Ambulatory Visit: Payer: Medicaid Other | Attending: Cardiology

## 2024-05-18 ENCOUNTER — Encounter: Payer: Self-pay | Admitting: Internal Medicine

## 2024-05-18 ENCOUNTER — Ambulatory Visit (INDEPENDENT_AMBULATORY_CARE_PROVIDER_SITE_OTHER)

## 2024-05-18 ENCOUNTER — Ambulatory Visit

## 2024-05-18 ENCOUNTER — Ambulatory Visit: Attending: Internal Medicine | Admitting: Internal Medicine

## 2024-05-18 VITALS — BP 122/80 | HR 64 | Resp 14 | Ht 61.0 in | Wt 186.0 lb

## 2024-05-18 DIAGNOSIS — M79672 Pain in left foot: Secondary | ICD-10-CM | POA: Diagnosis not present

## 2024-05-18 DIAGNOSIS — E559 Vitamin D deficiency, unspecified: Secondary | ICD-10-CM | POA: Diagnosis present

## 2024-05-18 DIAGNOSIS — B1920 Unspecified viral hepatitis C without hepatic coma: Secondary | ICD-10-CM | POA: Diagnosis present

## 2024-05-18 DIAGNOSIS — M79671 Pain in right foot: Secondary | ICD-10-CM

## 2024-05-18 DIAGNOSIS — L900 Lichen sclerosus et atrophicus: Secondary | ICD-10-CM | POA: Insufficient documentation

## 2024-05-18 DIAGNOSIS — L280 Lichen simplex chronicus: Secondary | ICD-10-CM | POA: Diagnosis present

## 2024-05-18 DIAGNOSIS — M25561 Pain in right knee: Secondary | ICD-10-CM | POA: Diagnosis not present

## 2024-05-18 DIAGNOSIS — M79641 Pain in right hand: Secondary | ICD-10-CM

## 2024-05-18 DIAGNOSIS — M069 Rheumatoid arthritis, unspecified: Secondary | ICD-10-CM

## 2024-05-18 DIAGNOSIS — Z79899 Other long term (current) drug therapy: Secondary | ICD-10-CM | POA: Insufficient documentation

## 2024-05-18 DIAGNOSIS — M79642 Pain in left hand: Secondary | ICD-10-CM

## 2024-05-18 DIAGNOSIS — M25562 Pain in left knee: Secondary | ICD-10-CM
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
## 2024-05-18 NOTE — Patient Instructions (Signed)
 We will run a prior authorization for Humira through your insurance and let you know once it's approved

## 2024-05-18 NOTE — Progress Notes (Signed)
 Pharmacy Note Subjective: Patient presents today to [facility] for follow up office visit. Patient seen by the pharmacist for counseling on Humira for rheumatoid arthritis.  Prior therapy includes: Methotrexate (2020), Plaquenil (2019), Enbrel (stopped due to Hives), Humira (2019).  Diagnosis of heart failure: No  Objective:  CBC No results found for: WBC, RBC, HGB, HCT, PLT, MCV, MCH, MCHC, RDW, LYMPHSABS, MONOABS, EOSABS, BASOSABS   CMP  No results found for: NA, K, CL, CO2, GLUCOSE, BUN, CREATININE, CALCIUM, PROT, ALBUMIN, AST, ALT, ALKPHOS, BILITOT, GFRNONAA, GFRAA    Baseline Immunosuppressant Therapy Labs TB GOLD   Hepatitis Panel   HIV No results found for: HIV Immunoglobulins   SPEP   G6PD No results found for: G6PDH TPMT No results found for: TPMT   Chest x-ray (01/13/2021): No acute cardiopulmonary abnormality   Assessment/Plan:  Counseled patient that Humira is a TNF blocking agent.  Counseled patient on purpose, proper use, and adverse effects of Humira.  Reviewed the most common adverse effects including infections, headache, and injection site reactions. Discussed that there is the possibility of an increased risk of malignancy including non-melanoma skin cancer but it is not well understood if this increased risk is due to the medication or the disease state.  Advised patient to get yearly dermatology exams due to risk of skin cancer. Counseled patient that Humira should be held prior to scheduled surgery.  Counseled patient to avoid live vaccines while on Humira.  Recommend annual influenza, PCV 15 or PCV20 or Pneumovax 23, and Shingrix as indicated.  Reviewed the importance of regular labs while on Humira therapy. Will monitor CBC and CMP 1 month after starting and then every 3 months routinely thereafter. Will monitor TB gold annually. Standing orders placed. Provided patient with  medication education material and answered all questions.  Patient consented to Humira.  Will upload consent into the media tab.  Reviewed storage instructions of Humira.  Advised initial injection must be administered in office.  Patient verbalized understanding.   Dermatology referral was placed today.  Dose will be for rheumatoid arthritis Humira 40 mg every 14 days.  Prescription pending lab results and/or insurance approval.

## 2024-05-19 ENCOUNTER — Telehealth: Payer: Self-pay

## 2024-05-19 DIAGNOSIS — Z79899 Other long term (current) drug therapy: Secondary | ICD-10-CM

## 2024-05-19 DIAGNOSIS — M069 Rheumatoid arthritis, unspecified: Secondary | ICD-10-CM

## 2024-05-19 NOTE — Telephone Encounter (Addendum)
 PA for Humira is pending baseline labs to result since Medicaid will require documentation of hepatitis and TB testing  ----- Message from Sherry GORMAN Pennant sent at 05/18/2024  8:43 AM EDT ----- Patient will be restarting Humira (at home) for seropositive RA (pending OV and baseline labs) Previously on treatment with [facility] Unable to take MTX and leflunomide due to history of positive Hep C

## 2024-05-21 LAB — QUANTIFERON-TB GOLD PLUS
Mitogen-NIL: 5.09 [IU]/mL
NIL: 0.01 [IU]/mL
QuantiFERON-TB Gold Plus: NEGATIVE
TB1-NIL: 0 [IU]/mL
TB2-NIL: 0 [IU]/mL

## 2024-05-21 LAB — HEPATITIS B CORE ANTIBODY, IGM: Hep B C IgM: NONREACTIVE

## 2024-05-21 LAB — CBC WITH DIFFERENTIAL/PLATELET
Absolute Lymphocytes: 1619 {cells}/uL (ref 850–3900)
Absolute Monocytes: 326 {cells}/uL (ref 200–950)
Basophils Absolute: 19 {cells}/uL (ref 0–200)
Basophils Relative: 0.3 %
Eosinophils Absolute: 128 {cells}/uL (ref 15–500)
Eosinophils Relative: 2 %
HCT: 37.9 % (ref 35.0–45.0)
Hemoglobin: 12.2 g/dL (ref 11.7–15.5)
MCH: 32.5 pg (ref 27.0–33.0)
MCHC: 32.2 g/dL (ref 32.0–36.0)
MCV: 101.1 fL — ABNORMAL HIGH (ref 80.0–100.0)
MPV: 10.3 fL (ref 7.5–12.5)
Monocytes Relative: 5.1 %
Neutro Abs: 4307 {cells}/uL (ref 1500–7800)
Neutrophils Relative %: 67.3 %
Platelets: 286 Thousand/uL (ref 140–400)
RBC: 3.75 Million/uL — ABNORMAL LOW (ref 3.80–5.10)
RDW: 14 % (ref 11.0–15.0)
Total Lymphocyte: 25.3 %
WBC: 6.4 Thousand/uL (ref 3.8–10.8)

## 2024-05-21 LAB — HEPATITIS C RNA QUANTITATIVE
HCV Quantitative Log: <1.18 {Log_IU}/mL
HCV RNA, PCR, QN: <15 [IU]/mL

## 2024-05-21 LAB — COMPREHENSIVE METABOLIC PANEL WITH GFR
AG Ratio: 1.4 (calc) (ref 1.0–2.5)
ALT: 14 U/L (ref 6–29)
AST: 12 U/L (ref 10–30)
Albumin: 4.1 g/dL (ref 3.6–5.1)
Alkaline phosphatase (APISO): 76 U/L (ref 31–125)
BUN/Creatinine Ratio: 12 (calc) (ref 6–22)
BUN: 12 mg/dL (ref 7–25)
CO2: 30 mmol/L (ref 20–32)
Calcium: 8.9 mg/dL (ref 8.6–10.2)
Chloride: 104 mmol/L (ref 98–110)
Creat: 1.02 mg/dL — ABNORMAL HIGH (ref 0.50–0.97)
Globulin: 2.9 g/dL (ref 1.9–3.7)
Glucose, Bld: 84 mg/dL (ref 65–99)
Potassium: 4 mmol/L (ref 3.5–5.3)
Sodium: 139 mmol/L (ref 135–146)
Total Bilirubin: 0.3 mg/dL (ref 0.2–1.2)
Total Protein: 7 g/dL (ref 6.1–8.1)
eGFR: 72 mL/min/1.73m2 (ref >=60)

## 2024-05-21 LAB — SEDIMENTATION RATE: Sed Rate: 11 mm/h (ref 0–20)

## 2024-05-21 LAB — VITAMIN D 25 HYDROXY (VIT D DEFICIENCY, FRACTURES): Vit D, 25-Hydroxy: 39 ng/mL (ref 30–100)

## 2024-05-21 LAB — RHEUMATOID FACTOR: Rheumatoid fact SerPl-aCnc: 107 [IU]/mL — ABNORMAL HIGH (ref <14)

## 2024-05-21 LAB — HEPATITIS B SURFACE ANTIGEN: Hepatitis B Surface Ag: NONREACTIVE

## 2024-05-21 LAB — C-REACTIVE PROTEIN: CRP: 3.4 mg/L (ref <8.0)

## 2024-05-21 NOTE — Telephone Encounter (Signed)
 SAVED a Prior Authorization request to [facility] for HUMIRA via CoverMyMeds. Will update once we receive a response.  Submission pending TB gold to result  Key: [id number]

## 2024-05-22 ENCOUNTER — Other Ambulatory Visit (HOSPITAL_COMMUNITY): Payer: Self-pay

## 2024-05-22 MED ORDER — HUMIRA (2 PEN) 40 MG/0.4ML ~~LOC~~ AJKT
40.0000 mg | AUTO-INJECTOR | SUBCUTANEOUS | 0 refills | Status: DC
  Filled 2024-05-27: qty 2, 28d supply, fill #0
  Filled 2024-06-08: qty 2, 28d supply, fill #1
  Filled 2024-06-23: qty 2, 28d supply, fill #2

## 2024-05-22 NOTE — Telephone Encounter (Signed)
 Received notification from [facility] regarding a prior authorization for HUMIRA. Authorization has been APPROVED from 05/22/2024 to 05/22/2025. Approval letter sent to scan center.  Per test claim, copay for 28 days supply is $4  Patient can fill through [facility]: [phone]   Sherry Pennant, PharmD, MPH, BCPS, CPP Clinical Pharmacist (Rheumatology and Pulmonology)

## 2024-05-22 NOTE — Telephone Encounter (Signed)
 PA for Humira submitted  Key: [id number]

## 2024-05-25 ENCOUNTER — Ambulatory Visit: Payer: Self-pay | Admitting: Internal Medicine

## 2024-05-25 ENCOUNTER — Other Ambulatory Visit: Payer: Self-pay | Admitting: Pharmacist

## 2024-05-25 NOTE — Progress Notes (Signed)
 Patient previously on Humira  for RA. Restart Humira  40mg  subcut every 14 days at home.  Sherry Pennant, PharmD, MPH, BCPS, CPP Clinical Pharmacist (Rheumatology and Pulmonology)

## 2024-05-25 NOTE — Telephone Encounter (Signed)
 Spoke with patient. She is comfortable with filling with [facility]. She has bene advised to await [facility]'s call for onboarding  Sherry Pennant, PharmD, MPH, BCPS, CPP Clinical Pharmacist (Rheumatology and Pulmonology)

## 2024-05-25 NOTE — Progress Notes (Signed)
 The rheumatoid factor test is very positive at 107.  All of her other lab results are okay with blood count and kidney and liver functions in the normal range.  Systemic inflammation seems okay with the sed rate and CRP in normal range.  Vitamin D  is also normal.  Screening for hepatitis was negative. X-rays of her joints did not show any signs of erosion from rheumatoid arthritis.  There is some moderate damage in the right wrist that may be from a combination of inflammation and use. No problem for restarting her on adalimumab  as planned.

## 2024-05-27 ENCOUNTER — Other Ambulatory Visit (HOSPITAL_COMMUNITY): Payer: Self-pay

## 2024-05-27 ENCOUNTER — Other Ambulatory Visit: Payer: Self-pay

## 2024-05-27 NOTE — Progress Notes (Signed)
 Specialty Pharmacy Initial Fill Coordination Note  Grisell Bissette Kopke is a 38 y.o. female contacted today regarding initial fill of specialty medication(s) Adalimumab  (Humira  (2 Pen))   Patient requested Delivery   Delivery date: 05/29/24   Verified address: [address]  [name] [facility] [place] [facility] [address]   Medication will be filled on 05/28/2024.   Patient is aware of $4 copayment. Credit card info collected and forwarded to [place] at [facility].

## 2024-06-08 ENCOUNTER — Other Ambulatory Visit: Payer: Self-pay

## 2024-06-08 ENCOUNTER — Other Ambulatory Visit (HOSPITAL_COMMUNITY): Payer: Self-pay

## 2024-06-08 NOTE — Progress Notes (Signed)
 Patient called today stating that her medication had sat outside for about 3 days because she requested that it be delivered to her back door and was unaware of delivery. Medication was refilled using lost medication override and fill entered into complaint log  New medication fill date: 06/09/24 To be delivered on 06/10/24  Note added to [place] for medication to be delivered to back door, and patient was told to make sure and check both doors when delivery is expected.

## 2024-06-09 ENCOUNTER — Other Ambulatory Visit: Payer: Self-pay

## 2024-06-12 ENCOUNTER — Other Ambulatory Visit: Payer: Self-pay

## 2024-06-23 ENCOUNTER — Other Ambulatory Visit: Payer: Self-pay

## 2024-06-23 NOTE — Progress Notes (Signed)
 Specialty Pharmacy Refill Coordination Note  Nyrie Sigal is a 38 y.o. female contacted today regarding refills of specialty medication(s) Adalimumab  (Humira  (2 Pen))   Patient requested Delivery   Delivery date: 07/08/24   Verified address: [address]  [name] [facility] [address]   Medication will be filled on 07/07/24.

## 2024-07-07 ENCOUNTER — Other Ambulatory Visit: Payer: Self-pay

## 2024-07-07 ENCOUNTER — Encounter: Payer: Self-pay | Admitting: Internal Medicine

## 2024-07-07 ENCOUNTER — Ambulatory Visit: Attending: Internal Medicine | Admitting: Internal Medicine

## 2024-07-07 VITALS — BP 111/62 | HR 56 | Temp 98.1°F | Resp 13 | Ht 60.0 in | Wt 189.8 lb

## 2024-07-07 DIAGNOSIS — M069 Rheumatoid arthritis, unspecified: Secondary | ICD-10-CM | POA: Diagnosis not present

## 2024-07-07 DIAGNOSIS — R600 Localized edema: Secondary | ICD-10-CM | POA: Insufficient documentation

## 2024-07-07 DIAGNOSIS — Z79899 Other long term (current) drug therapy: Secondary | ICD-10-CM | POA: Insufficient documentation

## 2024-07-07 LAB — CBC WITH DIFFERENTIAL/PLATELET
Absolute Lymphocytes: 1774 {cells}/uL (ref 850–3900)
Absolute Monocytes: 519 {cells}/uL (ref 200–950)
Basophils Absolute: 20 {cells}/uL (ref 0–200)
Basophils Relative: 0.2 %
Eosinophils Absolute: 118 {cells}/uL (ref 15–500)
Eosinophils Relative: 1.2 %
HCT: 34.1 % — ABNORMAL LOW (ref 35.0–45.0)
Hemoglobin: 11.2 g/dL — ABNORMAL LOW (ref 11.7–15.5)
MCH: 33.2 pg — ABNORMAL HIGH (ref 27.0–33.0)
MCHC: 32.8 g/dL (ref 32.0–36.0)
MCV: 101.2 fL — ABNORMAL HIGH (ref 80.0–100.0)
MPV: 10.7 fL (ref 7.5–12.5)
Monocytes Relative: 5.3 %
Neutro Abs: 7370 {cells}/uL (ref 1500–7800)
Neutrophils Relative %: 75.2 %
Platelets: 247 Thousand/uL (ref 140–400)
RBC: 3.37 Million/uL — ABNORMAL LOW (ref 3.80–5.10)
RDW: 13 % (ref 11.0–15.0)
Total Lymphocyte: 18.1 %
WBC: 9.8 Thousand/uL (ref 3.8–10.8)

## 2024-07-07 LAB — COMPREHENSIVE METABOLIC PANEL WITH GFR
AG Ratio: 1.6 (calc) (ref 1.0–2.5)
ALT: 14 U/L (ref 6–29)
AST: 11 U/L (ref 10–30)
Albumin: 3.7 g/dL (ref 3.6–5.1)
Alkaline phosphatase (APISO): 65 U/L (ref 31–125)
BUN/Creatinine Ratio: 13 (calc) (ref 6–22)
BUN: 13 mg/dL (ref 7–25)
CO2: 29 mmol/L (ref 20–32)
Calcium: 8.5 mg/dL — ABNORMAL LOW (ref 8.6–10.2)
Chloride: 105 mmol/L (ref 98–110)
Creat: 1.02 mg/dL — ABNORMAL HIGH (ref 0.50–0.97)
Globulin: 2.3 g/dL (ref 1.9–3.7)
Glucose, Bld: 94 mg/dL (ref 65–99)
Potassium: 4 mmol/L (ref 3.5–5.3)
Sodium: 139 mmol/L (ref 135–146)
Total Bilirubin: 0.2 mg/dL (ref 0.2–1.2)
Total Protein: 6 g/dL — ABNORMAL LOW (ref 6.1–8.1)
eGFR: 72 mL/min/1.73m2 (ref >=60)

## 2024-07-07 LAB — SEDIMENTATION RATE: Sed Rate: 11 mm/h (ref 0–20)

## 2024-07-07 MED ORDER — HUMIRA (2 PEN) 40 MG/0.4ML ~~LOC~~ AJKT
40.0000 mg | AUTO-INJECTOR | SUBCUTANEOUS | 0 refills | Status: DC
  Filled 2024-07-07 – 2024-08-04 (×2): qty 6, 84d supply, fill #0
  Filled 2024-08-06: qty 2, 28d supply, fill #0
  Filled 2024-08-31: qty 2, 28d supply, fill #1
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
## 2024-07-27 ENCOUNTER — Other Ambulatory Visit: Payer: Self-pay

## 2024-08-04 ENCOUNTER — Other Ambulatory Visit (HOSPITAL_COMMUNITY): Payer: Self-pay

## 2024-08-06 ENCOUNTER — Other Ambulatory Visit: Payer: Self-pay

## 2024-08-06 NOTE — Progress Notes (Signed)
 Specialty Pharmacy Refill Coordination Note  Michelle Jennings is a 38 y.o. female contacted today regarding refills of specialty medication(s) Adalimumab  (Humira  (2 Pen))   Patient requested Delivery   Delivery date: 08/11/24   Verified address: [address]  [name] [facility] [id number]   Medication will be filled on 08/10/24.

## 2024-08-07 ENCOUNTER — Other Ambulatory Visit: Payer: Self-pay

## 2024-08-10 ENCOUNTER — Other Ambulatory Visit: Payer: Self-pay

## 2024-08-18 ENCOUNTER — Encounter: Payer: Self-pay | Admitting: Cardiology

## 2024-08-18 ENCOUNTER — Encounter: Payer: Self-pay | Admitting: Internal Medicine

## 2024-08-31 ENCOUNTER — Other Ambulatory Visit: Payer: Self-pay

## 2024-09-02 ENCOUNTER — Other Ambulatory Visit: Payer: Self-pay

## 2024-09-02 ENCOUNTER — Other Ambulatory Visit: Payer: Self-pay | Admitting: Pharmacy Technician

## 2024-09-02 NOTE — Progress Notes (Signed)
 Specialty Pharmacy Refill Coordination Note  Michelle Jennings is a 38 y.o. female contacted today regarding refills of specialty medication(s) Adalimumab  (Humira  (2 Pen))   Patient requested Delivery   Delivery date: 09/11/24   Verified address: [address]  [place]   Medication will be filled on: 09/10/24

## 2024-09-03 ENCOUNTER — Other Ambulatory Visit: Payer: Self-pay

## 2024-09-03 NOTE — Progress Notes (Signed)
 Clinical Intervention Note  Clinical Intervention Notes: Patient reported starting Zofran and tramadol, neither interact with her Humira .   Clinical Intervention Outcomes: Prevention of an adverse drug event   Silvano LOISE Blair Karel Santa

## 2024-09-10 ENCOUNTER — Other Ambulatory Visit: Payer: Self-pay

## 2024-09-22 DIAGNOSIS — N951 Menopausal and female climacteric states: Secondary | ICD-10-CM

## 2024-09-22 HISTORY — DX: Menopausal and female climacteric states: N95.1
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
## 2024-09-29 ENCOUNTER — Ambulatory Visit: Attending: Internal Medicine | Admitting: Internal Medicine

## 2024-09-29 ENCOUNTER — Other Ambulatory Visit: Payer: Self-pay

## 2024-09-29 ENCOUNTER — Encounter: Payer: Self-pay | Admitting: Internal Medicine

## 2024-09-29 VITALS — BP 114/70 | HR 55 | Temp 97.6°F | Resp 16 | Ht 60.0 in | Wt 189.8 lb

## 2024-09-29 DIAGNOSIS — R600 Localized edema: Secondary | ICD-10-CM

## 2024-09-29 DIAGNOSIS — M25561 Pain in right knee: Secondary | ICD-10-CM | POA: Insufficient documentation

## 2024-09-29 DIAGNOSIS — B1920 Unspecified viral hepatitis C without hepatic coma: Secondary | ICD-10-CM

## 2024-09-29 DIAGNOSIS — Z79899 Other long term (current) drug therapy: Secondary | ICD-10-CM

## 2024-09-29 DIAGNOSIS — G8929 Other chronic pain: Secondary | ICD-10-CM

## 2024-09-29 DIAGNOSIS — M069 Rheumatoid arthritis, unspecified: Secondary | ICD-10-CM

## 2024-09-29 MED ORDER — HUMIRA (2 PEN) 40 MG/0.4ML ~~LOC~~ AJKT
40.0000 mg | AUTO-INJECTOR | SUBCUTANEOUS | 0 refills | Status: AC
  Filled 2024-09-29: qty 6, 84d supply, fill #0
  Filled 2024-10-01: qty 2, 28d supply, fill #0
  Filled 2024-11-11: qty 2, 28d supply, fill #1

## 2024-09-29 MED ORDER — PREDNISONE 5 MG PO TABS: ORAL_TABLET | ORAL | 0 refills | Status: AC

## 2024-09-30 LAB — CBC WITH DIFFERENTIAL/PLATELET
Absolute Lymphocytes: 1510 {cells}/uL (ref 850–3900)
Absolute Monocytes: 530 {cells}/uL (ref 200–950)
Basophils Absolute: 20 {cells}/uL (ref 0–200)
Basophils Relative: 0.2 %
Eosinophils Absolute: 20 {cells}/uL (ref 15–500)
Eosinophils Relative: 0.2 %
HCT: 33.6 % — ABNORMAL LOW (ref 35.9–46.0)
Hemoglobin: 11 g/dL — ABNORMAL LOW (ref 11.7–15.5)
MCH: 31.8 pg (ref 27.0–33.0)
MCHC: 32.7 g/dL (ref 31.6–35.4)
MCV: 97.1 fL (ref 81.4–101.7)
MPV: 10.5 fL (ref 7.5–12.5)
Monocytes Relative: 5.2 %
Neutro Abs: 8119 {cells}/uL — ABNORMAL HIGH (ref 1500–7800)
Neutrophils Relative %: 79.6 %
Platelets: 303 Thousand/uL (ref 140–400)
RBC: 3.46 Million/uL — ABNORMAL LOW (ref 3.80–5.10)
RDW: 13.4 % (ref 11.0–15.0)
Total Lymphocyte: 14.8 %
WBC: 10.2 Thousand/uL (ref 3.8–10.8)

## 2024-09-30 LAB — C-REACTIVE PROTEIN: CRP: <3 mg/L (ref <8.0)

## 2024-09-30 LAB — COMPREHENSIVE METABOLIC PANEL WITH GFR
AG Ratio: 1.4 (calc) (ref 1.0–2.5)
ALT: 18 U/L (ref 6–29)
AST: 11 U/L (ref 10–30)
Albumin: 3.9 g/dL (ref 3.6–5.1)
Alkaline phosphatase (APISO): 76 U/L (ref 31–125)
BUN/Creatinine Ratio: 16 (calc) (ref 6–22)
BUN: 19 mg/dL (ref 7–25)
CO2: 28 mmol/L (ref 20–32)
Calcium: 8.9 mg/dL (ref 8.6–10.2)
Chloride: 105 mmol/L (ref 98–110)
Creat: 1.17 mg/dL — ABNORMAL HIGH (ref 0.50–0.99)
Globulin: 2.7 g/dL (ref 1.9–3.7)
Glucose, Bld: 107 mg/dL — ABNORMAL HIGH (ref 65–99)
Potassium: 3.4 mmol/L — ABNORMAL LOW (ref 3.5–5.3)
Sodium: 138 mmol/L (ref 135–146)
Total Bilirubin: 0.2 mg/dL (ref 0.2–1.2)
Total Protein: 6.6 g/dL (ref 6.1–8.1)
eGFR: 60 mL/min/1.73m2 (ref >=60)

## 2024-09-30 LAB — SEDIMENTATION RATE: Sed Rate: 33 mm/h — ABNORMAL HIGH (ref 0–20)

## 2024-10-01 ENCOUNTER — Other Ambulatory Visit: Payer: Self-pay

## 2024-10-01 ENCOUNTER — Other Ambulatory Visit (HOSPITAL_COMMUNITY): Payer: Self-pay

## 2024-10-05 ENCOUNTER — Other Ambulatory Visit (HOSPITAL_COMMUNITY): Payer: Self-pay

## 2024-10-05 ENCOUNTER — Ambulatory Visit: Payer: Self-pay | Admitting: Internal Medicine

## 2024-10-05 MED ORDER — METHOTREXATE SODIUM 2.5 MG PO TABS: 15.0000 mg | ORAL_TABLET | ORAL | 1 refills | Status: AC

## 2024-10-05 MED ORDER — FOLIC ACID 1 MG PO TABS: 1.0000 mg | ORAL_TABLET | Freq: Every day | ORAL | 0 refills | Status: AC

## 2024-10-05 NOTE — Telephone Encounter (Signed)
Addressed in associated result note.

## 2024-10-05 NOTE — Progress Notes (Signed)
 Sed rate of 33 is high, indicating active inflammation from her RA. This matches up with the reported joint swelling. Short term the prednisone  should help. I'd recommend she try starting the methotrexate  like we discussed as well. Starting dose 12.5 mg (5 tabs) once weekly and starting a folic acid  supplement daily.

## 2024-10-07 ENCOUNTER — Other Ambulatory Visit: Payer: Self-pay

## 2024-10-07 NOTE — Progress Notes (Signed)
 Specialty Pharmacy Refill Coordination Note  Michelle Jennings is a 38 y.o. female contacted today regarding refills of specialty medication(s) Adalimumab  (Humira  (2 Pen))   Patient requested Delivery   Delivery date: 10/20/24   Verified address: [address]  [place]   Medication will be filled on: 10/19/24

## 2024-10-19 ENCOUNTER — Other Ambulatory Visit: Payer: Self-pay

## 2024-11-09 ENCOUNTER — Other Ambulatory Visit (HOSPITAL_COMMUNITY): Payer: Self-pay

## 2024-11-10 ENCOUNTER — Ambulatory Visit: Admitting: Physician Assistant

## 2024-11-10 ENCOUNTER — Encounter: Payer: Self-pay | Admitting: Physician Assistant

## 2024-11-10 VITALS — BP 145/81 | HR 54

## 2024-11-10 DIAGNOSIS — D229 Melanocytic nevi, unspecified: Secondary | ICD-10-CM

## 2024-11-10 DIAGNOSIS — L578 Other skin changes due to chronic exposure to nonionizing radiation: Secondary | ICD-10-CM

## 2024-11-10 DIAGNOSIS — D1801 Hemangioma of skin and subcutaneous tissue: Secondary | ICD-10-CM | POA: Diagnosis not present

## 2024-11-10 DIAGNOSIS — L814 Other melanin hyperpigmentation: Secondary | ICD-10-CM

## 2024-11-10 DIAGNOSIS — Z1283 Encounter for screening for malignant neoplasm of skin: Secondary | ICD-10-CM

## 2024-11-10 DIAGNOSIS — W908XXA Exposure to other nonionizing radiation, initial encounter: Secondary | ICD-10-CM | POA: Diagnosis not present

## 2024-11-10 DIAGNOSIS — L900 Lichen sclerosus et atrophicus: Secondary | ICD-10-CM

## 2024-11-10 DIAGNOSIS — L821 Other seborrheic keratosis: Secondary | ICD-10-CM

## 2024-11-10 DIAGNOSIS — D84821 Immunodeficiency due to drugs: Secondary | ICD-10-CM

## 2024-11-10 NOTE — Progress Notes (Signed)
" ° °  New Patient Visit   Subjective  Michelle Jennings is a 39 y.o. female NEW PATIENT who presents for the following:  Total Body Skin Exam (TBSE). Patient is on Humira  for RA.   Has history of lichen sclerosis and is under the care of GYN. Uses a cream sometimes but not as directed. Is on her period today.   The following portions of the chart were reviewed this encounter and updated as appropriate: medications, allergies, medical history  Review of Systems:  No other skin or systemic complaints except as noted in HPI or Assessment and Plan.  Objective  Well appearing patient in no apparent distress; mood and affect are within normal limits.  A full examination was performed including scalp, head, eyes, ears, nose, lips, neck, chest, axillae, abdomen, back, buttocks, bilateral upper extremities, bilateral lower extremities, hands, feet, fingers, toes, fingernails, and toenails. All findings within normal limits unless otherwise noted below.     Relevant exam findings are noted in the Assessment and Plan.    Assessment & Plan   LICHEN SCLEROSIS -- PER PATIENT  - on period - can evaluate at later date if GYN wishes - encouraged her to use Rx as directed   LENTIGINES, SEBORRHEIC KERATOSES, HEMANGIOMAS - Benign normal skin lesions - Benign-appearing - Call for any changes  MELANOCYTIC NEVI - Tan-brown and/or pink-flesh-colored symmetric macules and papules - Benign appearing on exam today - Observation - Call clinic for new or changing moles - Recommend daily use of broad spectrum spf 30+ sunscreen to sun-exposed areas.   ACTINIC DAMAGE - Chronic condition, secondary to cumulative UV/sun exposure - diffuse scaly erythematous macules with underlying dyspigmentation - Recommend daily broad spectrum sunscreen SPF 30+ to sun-exposed areas, reapply every 2 hours as needed.  - Staying in the shade or wearing long sleeves, sun glasses (UVA+UVB protection) and wide brim hats  (4-inch brim around the entire circumference of the hat) are also recommended for sun protection.  - Call for new or changing lesions.  SKIN CANCER SCREENING PERFORMED TODAY ACTINIC SKIN DAMAGE   CHERRY ANGIOMA   LENTIGINES   MULTIPLE BENIGN NEVI   SEBORRHEIC KERATOSIS   SCREENING EXAM FOR SKIN CANCER   LICHEN SCLEROSUS   IMMUNOSUPPRESSION DUE TO DRUG THERAPY    Return in about 2 years (around 11/10/2026) for TBSE.   I, Doyce Pan, CMA, am acting as scribe for Norvell Caswell K, PA-C.   Documentation: I have reviewed the above documentation for accuracy and completeness, and I agree with the above.  Tyse Auriemma K, PA-C   "

## 2024-11-10 NOTE — Patient Instructions (Signed)

## 2024-11-11 ENCOUNTER — Other Ambulatory Visit: Payer: Self-pay

## 2024-11-11 ENCOUNTER — Encounter (INDEPENDENT_AMBULATORY_CARE_PROVIDER_SITE_OTHER): Payer: Self-pay

## 2024-11-20 ENCOUNTER — Ambulatory Visit: Admitting: Podiatry

## 2024-12-29 ENCOUNTER — Ambulatory Visit: Admitting: Internal Medicine
